# Patient Record
Sex: Female | Born: 1995 | Marital: Single | Smoking: Never smoker
Health system: Southern US, Community
[De-identification: ages and names within clinical notes are randomized; demographics above are authoritative.]

## PROBLEM LIST (undated history)

## (undated) DIAGNOSIS — O039 Complete or unspecified spontaneous abortion without complication: Secondary | ICD-10-CM

## (undated) DIAGNOSIS — Z789 Other specified health status: Secondary | ICD-10-CM

## (undated) HISTORY — PX: OTHER SURGICAL HISTORY: SHX169

## (undated) HISTORY — DX: Complete or unspecified spontaneous abortion without complication: O03.9

## (undated) HISTORY — DX: Other specified health status: Z78.9

---

## 2019-10-01 ENCOUNTER — Other Ambulatory Visit: Payer: Self-pay | Admitting: *Deleted

## 2019-10-01 DIAGNOSIS — Z20822 Contact with and (suspected) exposure to covid-19: Secondary | ICD-10-CM

## 2019-10-02 LAB — NOVEL CORONAVIRUS, NAA: SARS-CoV-2, NAA: NOT DETECTED

## 2020-08-24 ENCOUNTER — Ambulatory Visit: Payer: 59 | Admitting: Adult Health

## 2020-08-24 ENCOUNTER — Encounter: Payer: Self-pay | Admitting: Adult Health

## 2020-08-24 ENCOUNTER — Other Ambulatory Visit: Payer: Self-pay

## 2020-08-24 ENCOUNTER — Other Ambulatory Visit (HOSPITAL_COMMUNITY)
Admission: RE | Admit: 2020-08-24 | Discharge: 2020-08-24 | Disposition: A | Discharge status: Home / Self Care | Payer: 59 | Source: Ambulatory Visit | Attending: Adult Health | Admitting: Adult Health

## 2020-08-24 VITALS — BP 154/97 | HR 104 | Ht 65.0 in | Wt 207.0 lb

## 2020-08-24 DIAGNOSIS — Z3202 Encounter for pregnancy test, result negative: Secondary | ICD-10-CM

## 2020-08-24 DIAGNOSIS — Z113 Encounter for screening for infections with a predominantly sexual mode of transmission: Secondary | ICD-10-CM | POA: Diagnosis not present

## 2020-08-24 DIAGNOSIS — R030 Elevated blood-pressure reading, without diagnosis of hypertension: Secondary | ICD-10-CM | POA: Diagnosis not present

## 2020-08-24 DIAGNOSIS — N926 Irregular menstruation, unspecified: Secondary | ICD-10-CM | POA: Diagnosis not present

## 2020-08-24 LAB — POCT URINE PREGNANCY: Preg Test, Ur: NEGATIVE

## 2020-08-24 NOTE — Progress Notes (Signed)
  Subjective:     Patient ID: Erica Williams, female   DOB: December 02, 1996, 24 y.o.   MRN: [id number]  HPI Erica Williams is a 24 year old black female,single, G1P0010, in for UPT,has missed a period, just spotted in August and said took Plan B in July.  She is not sure if wants to be pregnant or not, has OCs at home just has not taken them or used a condom. No PCP.   Review of Systems +missed period Reviewed past medical,surgical, social and family history. Reviewed medications and allergies.     Objective:   Physical Exam BP (!) 154/97 (BP Location: Right Arm, Patient Position: Sitting, Cuff Size: Normal)   Pulse (!) 104   Ht 5\' 5"  (1.651 m)   Wt 207 lb (93.9 kg)   LMP 07/25/2020 (Exact Date) Comment: spotting for day and half  BMI 34.45 kg/m UPT is negative. Skin warm and dry.Pelvic: external genitalia is normal in appearance no lesions, vagina: scant white discharge with odor,urethra has no lesions or masses noted, cervix:smooth, uterus: normal size, shape and contour, non tender, no masses felt, adnexa: no masses or tenderness noted. Bladder is non tender and no masses felt. CV swab obtained.  Upstream - 08/24/20 1428      Pregnancy Intention Screening   Does the patient want to become pregnant in the next year? Unsure    Does the patient's partner want to become pregnant in the next year? Unsure    Would the patient like to discuss contraceptive options today? No      Contraception Wrap Up   Current Method No Method - Other Reason    End Method No Method - Other Reason    Contraception Counseling Provided No         Examination chaperoned by 08/26/20 LPN.    Assessment:     1. Urine pregnancy test negative  2. Missed period Call if no period in 2 weeks   3. Screening examination for STD (sexually transmitted disease) CV swab sent Check HIV,RPR, and hepatitis C antibody   4. Elevated BP without diagnosis of hypertension Will recheck in 4 weeks Review DASH diet      Plan:     Will talk when results back  Return in 4 weeks for pap and physical

## 2020-08-24 NOTE — Patient Instructions (Signed)
DASH Eating Plan DASH stands for "Dietary Approaches to Stop Hypertension." The DASH eating plan is a healthy eating plan that has been shown to reduce high blood pressure (hypertension). It may also reduce your risk for type 2 diabetes, heart disease, and stroke. The DASH eating plan may also help with weight loss. What are tips for following this plan?  General guidelines  Avoid eating more than 2,300 mg (milligrams) of salt (sodium) a day. If you have hypertension, you may need to reduce your sodium intake to 1,500 mg a day.  Limit alcohol intake to no more than 1 drink a day for nonpregnant women and 2 drinks a day for men. One drink equals 12 oz of beer, 5 oz of wine, or 1 oz of hard liquor.  Work with your health care provider to maintain a healthy body weight or to lose weight. Ask what an ideal weight is for you.  Get at least 30 minutes of exercise that causes your heart to beat faster (aerobic exercise) most days of the week. Activities may include walking, swimming, or biking.  Work with your health care provider or diet and nutrition specialist (dietitian) to adjust your eating plan to your individual calorie needs. Reading food labels   Check food labels for the amount of sodium per serving. Choose foods with less than 5 percent of the Daily Value of sodium. Generally, foods with less than 300 mg of sodium per serving fit into this eating plan.  To find whole grains, look for the word "whole" as the first word in the ingredient list. Shopping  Buy products labeled as "low-sodium" or "no salt added."  Buy fresh foods. Avoid canned foods and premade or frozen meals. Cooking  Avoid adding salt when cooking. Use salt-free seasonings or herbs instead of table salt or sea salt. Check with your health care provider or pharmacist before using salt substitutes.  Do not fry foods. Cook foods using healthy methods such as baking, boiling, grilling, and broiling instead.  Cook with  heart-healthy oils, such as olive, canola, soybean, or sunflower oil. Meal planning  Eat a balanced diet that includes: ? 5 or more servings of fruits and vegetables each day. At each meal, try to fill half of your plate with fruits and vegetables. ? Up to 6-8 servings of whole grains each day. ? Less than 6 oz of lean meat, poultry, or fish each day. A 3-oz serving of meat is about the same size as a deck of cards. One egg equals 1 oz. ? 2 servings of low-fat dairy each day. ? A serving of nuts, seeds, or beans 5 times each week. ? Heart-healthy fats. Healthy fats called Omega-3 fatty acids are found in foods such as flaxseeds and coldwater fish, like sardines, salmon, and mackerel.  Limit how much you eat of the following: ? Canned or prepackaged foods. ? Food that is high in trans fat, such as fried foods. ? Food that is high in saturated fat, such as fatty meat. ? Sweets, desserts, sugary drinks, and other foods with added sugar. ? Full-fat dairy products.  Do not salt foods before eating.  Try to eat at least 2 vegetarian meals each week.  Eat more home-cooked food and less restaurant, buffet, and fast food.  When eating at a restaurant, ask that your food be prepared with less salt or no salt, if possible. What foods are recommended? The items listed may not be a complete list. Talk with your dietitian about   what dietary choices are best for you. Grains Whole-grain or whole-wheat bread. Whole-grain or whole-wheat pasta. Brown rice. Oatmeal. Quinoa. Bulgur. Whole-grain and low-sodium cereals. Pita bread. Low-fat, low-sodium crackers. Whole-wheat flour tortillas. Vegetables Fresh or frozen vegetables (raw, steamed, roasted, or grilled). Low-sodium or reduced-sodium tomato and vegetable juice. Low-sodium or reduced-sodium tomato sauce and tomato paste. Low-sodium or reduced-sodium canned vegetables. Fruits All fresh, dried, or frozen fruit. Canned fruit in natural juice (without  added sugar). Meat and other protein foods Skinless chicken or turkey. Ground chicken or turkey. Pork with fat trimmed off. Fish and seafood. Egg whites. Dried beans, peas, or lentils. Unsalted nuts, nut butters, and seeds. Unsalted canned beans. Lean cuts of beef with fat trimmed off. Low-sodium, lean deli meat. Dairy Low-fat (1%) or fat-free (skim) milk. Fat-free, low-fat, or reduced-fat cheeses. Nonfat, low-sodium ricotta or cottage cheese. Low-fat or nonfat yogurt. Low-fat, low-sodium cheese. Fats and oils Soft margarine without trans fats. Vegetable oil. Low-fat, reduced-fat, or light mayonnaise and salad dressings (reduced-sodium). Canola, safflower, olive, soybean, and sunflower oils. Avocado. Seasoning and other foods Herbs. Spices. Seasoning mixes without salt. Unsalted popcorn and pretzels. Fat-free sweets. What foods are not recommended? The items listed may not be a complete list. Talk with your dietitian about what dietary choices are best for you. Grains Baked goods made with fat, such as croissants, muffins, or some breads. Dry pasta or rice meal packs. Vegetables Creamed or fried vegetables. Vegetables in a cheese sauce. Regular canned vegetables (not low-sodium or reduced-sodium). Regular canned tomato sauce and paste (not low-sodium or reduced-sodium). Regular tomato and vegetable juice (not low-sodium or reduced-sodium). Pickles. Olives. Fruits Canned fruit in a light or heavy syrup. Fried fruit. Fruit in cream or butter sauce. Meat and other protein foods Fatty cuts of meat. Ribs. Fried meat. Bacon. Sausage. Bologna and other processed lunch meats. Salami. Fatback. Hotdogs. Bratwurst. Salted nuts and seeds. Canned beans with added salt. Canned or smoked fish. Whole eggs or egg yolks. Chicken or turkey with skin. Dairy Whole or 2% milk, cream, and half-and-half. Whole or full-fat cream cheese. Whole-fat or sweetened yogurt. Full-fat cheese. Nondairy creamers. Whipped toppings.  Processed cheese and cheese spreads. Fats and oils Butter. Stick margarine. Lard. Shortening. Ghee. Bacon fat. Tropical oils, such as coconut, palm kernel, or palm oil. Seasoning and other foods Salted popcorn and pretzels. Onion salt, garlic salt, seasoned salt, table salt, and sea salt. Worcestershire sauce. Tartar sauce. Barbecue sauce. Teriyaki sauce. Soy sauce, including reduced-sodium. Steak sauce. Canned and packaged gravies. Fish sauce. Oyster sauce. Cocktail sauce. Horseradish that you find on the shelf. Ketchup. Mustard. Meat flavorings and tenderizers. Bouillon cubes. Hot sauce and Tabasco sauce. Premade or packaged marinades. Premade or packaged taco seasonings. Relishes. Regular salad dressings. Where to find more information:  National Heart, Lung, and Blood Institute: www.nhlbi.nih.gov  American Heart Association: www.heart.org Summary  The DASH eating plan is a healthy eating plan that has been shown to reduce high blood pressure (hypertension). It may also reduce your risk for type 2 diabetes, heart disease, and stroke.  With the DASH eating plan, you should limit salt (sodium) intake to 2,300 mg a day. If you have hypertension, you may need to reduce your sodium intake to 1,500 mg a day.  When on the DASH eating plan, aim to eat more fresh fruits and vegetables, whole grains, lean proteins, low-fat dairy, and heart-healthy fats.  Work with your health care provider or diet and nutrition specialist (dietitian) to adjust your eating plan to your   individual calorie needs. This information is not intended to replace advice given to you by your health care provider. Make sure you discuss any questions you have with your health care provider. Document Revised: 11/10/2017 Document Reviewed: 11/21/2016 Elsevier Patient Education  2020 Elsevier Inc.  

## 2020-08-25 ENCOUNTER — Telehealth: Payer: Self-pay | Admitting: *Deleted

## 2020-08-25 LAB — HEPATITIS C ANTIBODY: Hep C Virus Ab: <0.1 s/co ratio (ref 0.0–0.9)

## 2020-08-25 LAB — RPR: RPR Ser Ql: NONREACTIVE

## 2020-08-25 LAB — HIV ANTIBODY (ROUTINE TESTING W REFLEX): HIV Screen 4th Generation wRfx: NONREACTIVE

## 2020-08-25 NOTE — Telephone Encounter (Signed)
Telephoned patient at home number and advised patient of negative test results. Patient voiced understanding.

## 2020-08-26 LAB — CERVICOVAGINAL ANCILLARY ONLY
Bacterial Vaginitis (gardnerella): POSITIVE — AB
Candida Glabrata: NEGATIVE
Candida Vaginitis: POSITIVE — AB
Chlamydia: NEGATIVE
Comment: NEGATIVE
Comment: NEGATIVE
Comment: NEGATIVE
Comment: NEGATIVE
Comment: NEGATIVE
Comment: NORMAL
Neisseria Gonorrhea: NEGATIVE
Trichomonas: NEGATIVE

## 2020-08-28 ENCOUNTER — Telehealth: Payer: Self-pay | Admitting: Adult Health

## 2020-08-28 MED ORDER — METRONIDAZOLE 500 MG PO TABS: 500.0000 mg | ORAL_TABLET | Freq: Two times a day (BID) | ORAL | 0 refills | Status: DC

## 2020-08-28 MED ORDER — FLUCONAZOLE 150 MG PO TABS: 150.0000 mg | ORAL_TABLET | Freq: Every day | ORAL | 1 refills | Status: DC

## 2020-08-28 NOTE — Telephone Encounter (Signed)
Pt aware that +yeast and +BV on vaginal swab will rx diflucan and flagyl.other tests negative

## 2020-09-03 ENCOUNTER — Telehealth: Payer: Self-pay | Admitting: Adult Health

## 2020-09-03 NOTE — Telephone Encounter (Signed)
Patient called stating that she came into the office on 08/24/2020 for Bacterial Vaginosis, pt states that now she is having pain and burning when she urinated. She thinks she might have a UTI, we do not have any available appointment coming up. Spoke with the nurse and she stated that patient should go to Urgent care. I gave the patient the advised per nurse.

## 2020-09-05 ENCOUNTER — Emergency Department (HOSPITAL_COMMUNITY)
Admission: EM | Admit: 2020-09-05 | Discharge: 2020-09-05 | Disposition: A | Discharge status: Home / Self Care | Payer: 59 | Attending: Emergency Medicine | Admitting: Emergency Medicine

## 2020-09-05 ENCOUNTER — Emergency Department (HOSPITAL_COMMUNITY): Payer: 59

## 2020-09-05 ENCOUNTER — Encounter (HOSPITAL_COMMUNITY): Payer: Self-pay | Admitting: Emergency Medicine

## 2020-09-05 ENCOUNTER — Other Ambulatory Visit: Payer: Self-pay

## 2020-09-05 DIAGNOSIS — R1011 Right upper quadrant pain: Secondary | ICD-10-CM | POA: Diagnosis present

## 2020-09-05 DIAGNOSIS — N200 Calculus of kidney: Secondary | ICD-10-CM | POA: Diagnosis not present

## 2020-09-05 DIAGNOSIS — N23 Unspecified renal colic: Secondary | ICD-10-CM | POA: Diagnosis not present

## 2020-09-05 LAB — CBC
HCT: 40.2 % (ref 36.0–46.0)
Hemoglobin: 13.8 g/dL (ref 12.0–15.0)
MCH: 30.9 pg (ref 26.0–34.0)
MCHC: 34.3 g/dL (ref 30.0–36.0)
MCV: 90.1 fL (ref 80.0–100.0)
Platelets: 312 10*3/uL (ref 150–400)
RBC: 4.46 MIL/uL (ref 3.87–5.11)
RDW: 11.9 % (ref 11.5–15.5)
WBC: 13.9 10*3/uL — ABNORMAL HIGH (ref 4.0–10.5)
nRBC: 0 % (ref 0.0–0.2)

## 2020-09-05 LAB — URINALYSIS, ROUTINE W REFLEX MICROSCOPIC
Bacteria, UA: NONE SEEN
Bilirubin Urine: NEGATIVE
Glucose, UA: NEGATIVE mg/dL
Ketones, ur: 80 mg/dL — AB
Leukocytes,Ua: NEGATIVE
Nitrite: NEGATIVE
Protein, ur: NEGATIVE mg/dL
Specific Gravity, Urine: 1.019 (ref 1.005–1.030)
pH: 6 (ref 5.0–8.0)

## 2020-09-05 LAB — COMPREHENSIVE METABOLIC PANEL
ALT: 22 U/L (ref 0–44)
AST: 24 U/L (ref 15–41)
Albumin: 4.3 g/dL (ref 3.5–5.0)
Alkaline Phosphatase: 43 U/L (ref 38–126)
Anion gap: 10 (ref 5–15)
BUN: 15 mg/dL (ref 6–20)
CO2: 25 mmol/L (ref 22–32)
Calcium: 9.2 mg/dL (ref 8.9–10.3)
Chloride: 99 mmol/L (ref 98–111)
Creatinine, Ser: 1.11 mg/dL — ABNORMAL HIGH (ref 0.44–1.00)
GFR calc Af Amer: >60 mL/min (ref >60)
GFR calc non Af Amer: >60 mL/min (ref >60)
Glucose, Bld: 81 mg/dL (ref 70–99)
Potassium: 3.8 mmol/L (ref 3.5–5.1)
Sodium: 134 mmol/L — ABNORMAL LOW (ref 135–145)
Total Bilirubin: 0.8 mg/dL (ref 0.3–1.2)
Total Protein: 8.4 g/dL — ABNORMAL HIGH (ref 6.5–8.1)

## 2020-09-05 LAB — PREGNANCY, URINE: Preg Test, Ur: NEGATIVE

## 2020-09-05 LAB — LIPASE, BLOOD: Lipase: 19 U/L (ref 11–51)

## 2020-09-05 MED ORDER — FENTANYL CITRATE (PF) 100 MCG/2ML IJ SOLN
50.0000 ug | Freq: Once | INTRAMUSCULAR | Status: AC
  Administered 2020-09-05: 50 ug via INTRAVENOUS
  Filled 2020-09-05: qty 2

## 2020-09-05 MED ORDER — ONDANSETRON HCL 4 MG PO TABS: 4.0000 mg | ORAL_TABLET | Freq: Three times a day (TID) | ORAL | 0 refills | Status: DC | PRN

## 2020-09-05 MED ORDER — OXYCODONE-ACETAMINOPHEN 5-325 MG PO TABS: 1.0000 | ORAL_TABLET | ORAL | 0 refills | Status: DC | PRN

## 2020-09-05 MED ORDER — ONDANSETRON HCL 4 MG/2ML IJ SOLN
4.0000 mg | Freq: Once | INTRAMUSCULAR | Status: AC
  Administered 2020-09-05: 4 mg via INTRAVENOUS
  Filled 2020-09-05: qty 2

## 2020-09-05 NOTE — ED Triage Notes (Signed)
Pt presents today right side abd pain x3 days. Pt taking tylenol. Went to urgent care but they weren't about to do any imaging.

## 2020-09-05 NOTE — ED Provider Notes (Signed)
[facility] EMERGENCY DEPARTMENT Provider Note   CSN: [id number] Arrival date & time: 09/05/20  1417     History Chief Complaint  Patient presents with  . Flank Pain    Erica Williams is a 24 y.o. female who presents for evaluation of abdominal and flank pain. Patient states that she has had 3 days of severe pain in her right flank and right upper quadrant radiating down into the right leg. She states that sometimes it feels worse with movement but mostly she is having pain that is constant, waxing and waning, and does not seem to get any better with position, movement or Tylenol. Nothing seems to make it worsen. She denies urinary symptoms or vaginal symptoms. She never had anything like this before. She has no previous history of surgeries to her abdomen. She was seen at the urgent care told to have blood in her urine, might have a kidney stone she come to the emergency department for further evaluation.  HPI     History reviewed. No pertinent past medical history.  Patient Active Problem List   Diagnosis Date Noted  . Missed period 08/24/2020  . Urine pregnancy test negative 08/24/2020  . Screening examination for STD (sexually transmitted disease) 08/24/2020  . Elevated BP without diagnosis of hypertension 08/24/2020    Past Surgical History:  Procedure Laterality Date  . left knee surgery Left      OB History    Gravida  1   Para      Term      Preterm      AB  1   Living        SAB      TAB  1   Ectopic      Multiple      Live Births              Family History  Problem Relation Age of Onset  . Hypertension Father   . Hypertension Mother     Social History   Tobacco Use  . Smoking status: Never Smoker  . Smokeless tobacco: Never Used  Vaping Use  . Vaping Use: Every day  . Substances: Flavoring  Substance Use Topics  . Alcohol use: Not Currently  . Drug use: Never    Home Medications Prior to Admission medications   Medication  Sig Start Date End Date Taking? Authorizing Provider  fluconazole (DIFLUCAN) 150 MG tablet Take 1 tablet (150 mg total) by mouth daily. 08/28/20   Adline Potter, NP  metroNIDAZOLE (FLAGYL) 500 MG tablet Take 1 tablet (500 mg total) by mouth 2 (two) times daily. 08/28/20   Adline Potter, NP  ondansetron (ZOFRAN) 4 MG tablet Take 1 tablet (4 mg total) by mouth every 8 (eight) hours as needed for nausea or vomiting. 09/05/20   Arthor Captain, PA-C  oxyCODONE-acetaminophen (PERCOCET/ROXICET) 5-325 MG tablet Take 1 tablet by mouth every 4 (four) hours as needed for severe pain. 09/05/20   Arthor Captain, PA-C    Allergies    Patient has no known allergies.  Review of Systems   Review of Systems Ten systems reviewed and are negative for acute change, except as noted in the HPI.  Physical Exam Updated Vital Signs BP 124/72 (BP Location: Right Arm)   Pulse 77   Temp 97.8 F (36.6 C) (Oral)   Resp 16   Ht 5\' 5"  (1.651 m)   Wt 93.9 kg   LMP 08/26/2020   SpO2 100%   BMI  34.45 kg/m   Physical Exam Vitals and nursing note reviewed.  Constitutional:      General: She is not in acute distress.    Appearance: She is well-developed. She is not diaphoretic.  HENT:     Head: Normocephalic and atraumatic.  Eyes:     General: No scleral icterus.    Conjunctiva/sclera: Conjunctivae normal.  Cardiovascular:     Rate and Rhythm: Normal rate and regular rhythm.     Heart sounds: Normal heart sounds. No murmur heard.  No friction rub. No gallop.   Pulmonary:     Effort: Pulmonary effort is normal. No respiratory distress.     Breath sounds: Normal breath sounds.  Abdominal:     General: Bowel sounds are normal. There is no distension.     Palpations: Abdomen is soft. There is no mass.     Tenderness: There is no abdominal tenderness. There is right CVA tenderness. There is no guarding.  Musculoskeletal:     Cervical back: Normal range of motion.  Skin:    General: Skin is warm and  dry.  Neurological:     Mental Status: She is alert and oriented to person, place, and time.  Psychiatric:        Behavior: Behavior normal.     ED Results / Procedures / Treatments   Labs (all labs ordered are listed, but only abnormal results are displayed) Labs Reviewed  URINALYSIS, ROUTINE W REFLEX MICROSCOPIC - Abnormal; Notable for the following components:      Result Value   Hgb urine dipstick MODERATE (*)    Ketones, ur 80 (*)    All other components within normal limits  CBC - Abnormal; Notable for the following components:   WBC 13.9 (*)    All other components within normal limits  COMPREHENSIVE METABOLIC PANEL - Abnormal; Notable for the following components:   Sodium 134 (*)    Creatinine, Ser 1.11 (*)    Total Protein 8.4 (*)    All other components within normal limits  PREGNANCY, URINE  LIPASE, BLOOD  I-STAT BETA HCG BLOOD, ED ([facility], [facility], [facility] ONLY)    EKG None  Radiology CT Renal Stone Study  Result Date: 09/05/2020 CLINICAL DATA:  Right-sided flank pain. EXAM: CT ABDOMEN AND PELVIS WITHOUT CONTRAST TECHNIQUE: Multidetector CT imaging of the abdomen and pelvis was performed following the standard protocol without IV contrast. COMPARISON:  None. FINDINGS: Lower chest: The lung bases are clear. The heart size is normal. Hepatobiliary: The liver is normal. Normal gallbladder.There is no biliary ductal dilation. Pancreas: Normal contours without ductal dilatation. No peripancreatic fluid collection. Spleen: Unremarkable. Adrenals/Urinary Tract: --Adrenal glands: Unremarkable. --Right kidney/ureter: There is mild right-sided hydroureteronephrosis secondary to an obstructing 1 mm stone in the distal right ureter nearly at the right UVJ (axial series 2, image 78). --Left kidney/ureter: No hydronephrosis or radiopaque kidney stones. --Urinary bladder: Unremarkable. Stomach/Bowel: --Stomach/Duodenum: No hiatal hernia or other gastric abnormality. Normal duodenal course and  caliber. --Small bowel: Unremarkable. --Colon: Unremarkable. --Appendix: Normal. Vascular/Lymphatic: Normal course and caliber of the major abdominal vessels. --No retroperitoneal lymphadenopathy. --No mesenteric lymphadenopathy. --No pelvic or inguinal lymphadenopathy. Reproductive: Unremarkable Other: No ascites or free air. The abdominal wall is normal. Musculoskeletal. No acute displaced fractures. IMPRESSION: Mild right-sided hydroureteronephrosis secondary to an obstructing 1 mm stone in the distal right ureter nearly at the right UVJ. Electronically Signed   By: Katherine Mantle M.D.   On: 09/05/2020 18:37    Procedures Procedures (including critical care  time)  Medications Ordered in ED Medications  fentaNYL (SUBLIMAZE) injection 50 mcg (50 mcg Intravenous Given 09/05/20 1856)  ondansetron (ZOFRAN) injection 4 mg (4 mg Intravenous Given 09/05/20 1848)    ED Course  I have reviewed the triage vital signs and the nursing notes.  Pertinent labs & imaging results that were available during my care of the patient were reviewed by me and considered in my medical decision making (see chart for details).    MDM Rules/Calculators/A&P                          Given the large differential diagnosis for Yuka Lallier, the decision making in this case is of high complexity. I have reviewed the patient's labs which show  CBC 13.9, CMP with slightly elevated creatinine at 1.11, normal BUN, pregnancy test negative, urine shows no evidence of infection, lipase within normal limits I personally reviewed the patient's images which show Mild right-sided hydroureteronephrosis secondary to 1 mm obstructing right-sided UVJ stone. After evaluating all of the data points in this case, the presentation of Jaymee Tilson is uncomplicated ureteral colic secondary to ureterolithiasis  The presentation NOT consistent with an infected stone, nephric abscess, sepsis, or renal failure.  Similarly, this  presentation is NOT consistent with AAA; Mesenteric Ischemia; Bowel Perforation; Bowel Obstruction; Sigmoid Volvulus; Diverticulitis; Appendicitis; Peritonitis; Cholecystitis, ascending cholangitis or other gallbladder disease; perforated ulcer; significant GI bleeding, splenic rupture/infarction; Hepatic abscess; or other surgical/acute abdomen.  Similarly, this presentation is NOT consistent with ACS or Myocardial Ischemia; Pulmonary Embolism; fistula; incarcerated hernia; Pancreatitis, Aortic Dissection; Diabetic Ketoacidosis; Ischemic colitis; Psoas or other abscess; Methanol poisoning; Heavy metal toxicity; or porphyria.  Similarly, this case is NOT consistent with Fitz-Hugh-Curtis Syndrome, Ectopic Pregnancy, Placental Abruption, PID, Tubo-ovarian abscess, Ovarian Torsion, or STI.  Similarly, this presentation is NOT consistent with acute coronary syndrome, pulmonary embolism, dissection, borhaave's, arrythmia, pneumothorax, cardiac tamponade, or other emergent cardiopulmonary condition.  Similarly, this presentation is NOT consistent with pyelonephritis, urinary infection, pneumonia, or other focal bacterial infection.  Strict return and follow-up precautions have been given by me personally or by detailed written instruction verbalized by nursing staff using the teach back method. to the patient/family/caregiver(s).  Data Reviewed/Counseling: I have reviwed the patient's vital signs, nursing notes, and other relevant tests/information. I had a detailed discussion regarding the historical points, exam findings, and any diagnostic results supporting the discharge diagnosis. I also discussed the need for outpatient follow-up and the need to return to the ED if symptoms worsen or if there are any questions or concerns that arise at home.  Final Clinical Impression(s) / ED Diagnoses Final diagnoses:  Renal colic  Kidney stone    Rx / DC Orders ED Discharge Orders         Ordered     oxyCODONE-acetaminophen (PERCOCET/ROXICET) 5-325 MG tablet  Every 4 hours PRN,   Status:  Discontinued        09/05/20 1940    ondansetron (ZOFRAN) 4 MG tablet  Every 8 hours PRN        09/05/20 1940    oxyCODONE-acetaminophen (PERCOCET/ROXICET) 5-325 MG tablet  Every 4 hours PRN        09/05/20 1952           Arthor Captain, PA-C 09/06/20 1048    Benjiman Core, MD 09/06/20 [phone]

## 2020-09-05 NOTE — Discharge Instructions (Addendum)
Return to the ED immediately if you develop fever, uncontrolled pain or vomiting, or other concerns.   Contact a health care provider if: You have a fever or chills. Your urine smells bad or looks cloudy. You have pain or burning when you pass urine. Get help right away if: Your flank pain or groin pain suddenly worsens. You become confused or disoriented or you lose consciousness. 

## 2020-09-07 MED FILL — Oxycodone w/ Acetaminophen Tab 5-325 MG: ORAL | Qty: 6 | Status: AC

## 2020-12-12 NOTE — L&D Delivery Note (Signed)
OB/GYN Faculty Practice Delivery Note  Erica Williams is a 25 y.o. G2P1011 s/p SVD at [redacted]w[redacted]d. She was admitted for PROM.   ROM: 49h 57m with clear fluid GBS Status: Positive Maximum Maternal Temperature: 99.3  Labor Progress: Patient presented after PROM, was induced with cytotec and foley balloon and ultimately with pitocin. She also had forebags that were artificially ruptured twice. She had a prolonged labor course and was on pitocin for ~30 hours and then progressed to complete.  Delivery Date/Time: 2150 on 08/31/2021 Delivery: Called to room and patient was complete and pushing. Head delivered LOA. No nuchal cord present. The head then restituted however there was a shoulder dystocia and was relieved after McRoberts, suprapubic pressure, sidelying release and ultimately the shoulder was delivered with delivery of the posterior arm . Infant with spontaneous cry, placed on mother's abdomen, dried and stimulated. Cord clamped x 2  cut by Dr. Ephriam Jenkins. Cord blood drawn. Placenta delivered spontaneously with gentle cord traction. Fundus was firm however patient had a boggy lower uterine segment Patient had gush of liquid after delivery of placenta and was given TXA and Methergine and Cytotec. We also did 3 manual sweeps and removed several golf ball size clots. Dr. Jolayne Panther was called to room in order to assess for JADA placement, and at that time her bleeding had stopped so no further uterine atony interventions were needed.  Labia, perineum, vagina, and cervix inspected and found to have a right periurethral laceration which was repaired with a two interrupted sutures with  3-0 vicryl on SH, and left labial which was repaired with a running stitch with 3-0 vicryl, and 1st degree perineal laceration which was repaired with a 3-0 vicryl on CTB with running stitches. All areas that were sutured were noted to have excellent hemostasis.  Placenta: intact, 3V cord, to pathology for marginal cord  insertion Complications: Hemorrhage EBL 1118 Lacerations: Right periurethral, left labial, and 1st degree EBL: 1118cc Analgesia: epidural  Infant: female  APGARs 44,9  3626g  [name], MD, MPH OB Fellow, [facility] for [facility], [facility] 08/31/2021, 12:31 AM

## 2021-02-01 ENCOUNTER — Other Ambulatory Visit: Payer: Self-pay

## 2021-02-01 ENCOUNTER — Other Ambulatory Visit (INDEPENDENT_AMBULATORY_CARE_PROVIDER_SITE_OTHER): Payer: BLUE CROSS/BLUE SHIELD

## 2021-02-01 VITALS — BP 141/89 | HR 97 | Ht 65.0 in | Wt 218.0 lb

## 2021-02-01 DIAGNOSIS — N926 Irregular menstruation, unspecified: Secondary | ICD-10-CM | POA: Diagnosis not present

## 2021-02-01 LAB — POCT URINE PREGNANCY: Preg Test, Ur: POSITIVE — AB

## 2021-02-01 MED ORDER — BONJESTA 20-20 MG PO TBCR: 1.0000 | EXTENDED_RELEASE_TABLET | Freq: Every day | ORAL | 8 refills | Status: DC

## 2021-02-01 NOTE — Addendum Note (Signed)
Addended by: Cheral Marker on: 02/01/2021 05:12 PM   Modules accepted: Orders

## 2021-02-01 NOTE — Progress Notes (Addendum)
   NURSE VISIT- PREGNANCY CONFIRMATION   SUBJECTIVE:  Erica Williams is a 25 y.o. G2P0010 female at [redacted]w[redacted]d by certain LMP of Patient's last menstrual period was 12/01/2020 (exact date). Here for pregnancy confirmation.  Home pregnancy test: positive x 2  She reports nausea.  She is not taking prenatal vitamins.    OBJECTIVE:  BP (!) 141/89 (BP Location: Left Arm, Patient Position: Sitting, Cuff Size: Normal)   Pulse 97   Ht 5\' 5"  (1.651 m)   Wt 218 lb (98.9 kg)   LMP 12/01/2020 (Exact Date)   BMI 36.28 kg/m   Appears well, in no apparent distress OB History  Gravida Para Term Preterm AB Living  2       1    SAB IAB Ectopic Multiple Live Births    1          # Outcome Date GA Lbr Len/2nd Weight Sex Delivery Anes PTL Lv  2 Current           1 IAB             Results for orders placed or performed in visit on 02/01/21 (from the past 24 hour(s))  POCT urine pregnancy   Collection Time: 02/01/21  4:24 PM  Result Value Ref Range   Preg Test, Ur Positive (A) Negative    ASSESSMENT: Positive pregnancy test, [redacted]w[redacted]d by LMP    PLAN: Schedule for dating ultrasound in 1 week Prenatal vitamins: plans to begin OTC ASAP   Nausea medicines: requested-note routed to [redacted]w[redacted]d, [name] to send prescription   OB packet given: Yes  Charleen Madera A Laylah Riga  02/01/2021 4:32 PM   Chart reviewed for nurse visit. Agree with plan of care. [facility] [place], Cheral Marker 02/01/2021 5:12 PM

## 2021-02-02 ENCOUNTER — Telehealth: Payer: Self-pay

## 2021-02-02 ENCOUNTER — Other Ambulatory Visit: Payer: 59

## 2021-02-02 NOTE — Telephone Encounter (Signed)
Called pt to inform her of prescription sent for nausea. Changed pt's address per pt request. Pt confirmed understanding.

## 2021-02-02 NOTE — Telephone Encounter (Signed)
-----   Message from Cheral Marker, [place] sent at 02/01/2021  5:12 PM EST ----- Let her know I rx'd [place] to [facility] in [place]. They will contact her and ship it to her house.

## 2021-02-16 ENCOUNTER — Other Ambulatory Visit: Payer: Self-pay | Admitting: Obstetrics & Gynecology

## 2021-02-16 DIAGNOSIS — O3680X0 Pregnancy with inconclusive fetal viability, not applicable or unspecified: Secondary | ICD-10-CM

## 2021-02-17 ENCOUNTER — Ambulatory Visit (INDEPENDENT_AMBULATORY_CARE_PROVIDER_SITE_OTHER): Payer: BLUE CROSS/BLUE SHIELD

## 2021-02-17 ENCOUNTER — Other Ambulatory Visit: Payer: Self-pay

## 2021-02-17 ENCOUNTER — Other Ambulatory Visit: Payer: BLUE CROSS/BLUE SHIELD

## 2021-02-17 DIAGNOSIS — O3680X0 Pregnancy with inconclusive fetal viability, not applicable or unspecified: Secondary | ICD-10-CM

## 2021-02-17 NOTE — Progress Notes (Signed)
[place] 11+1 wks,single IUP,CRL 48.76 mm,fhr 171 bpm,normal ovaries

## 2021-02-23 ENCOUNTER — Other Ambulatory Visit: Payer: Self-pay | Admitting: Obstetrics & Gynecology

## 2021-02-23 DIAGNOSIS — Z3682 Encounter for antenatal screening for nuchal translucency: Secondary | ICD-10-CM

## 2021-02-24 ENCOUNTER — Ambulatory Visit (INDEPENDENT_AMBULATORY_CARE_PROVIDER_SITE_OTHER): Payer: BLUE CROSS/BLUE SHIELD | Admitting: Women's Health

## 2021-02-24 ENCOUNTER — Encounter: Payer: Self-pay | Admitting: Women's Health

## 2021-02-24 ENCOUNTER — Other Ambulatory Visit: Payer: Self-pay

## 2021-02-24 ENCOUNTER — Other Ambulatory Visit (HOSPITAL_COMMUNITY)
Admission: RE | Admit: 2021-02-24 | Discharge: 2021-02-24 | Disposition: A | Discharge status: Home / Self Care | Payer: BLUE CROSS/BLUE SHIELD | Source: Ambulatory Visit | Attending: Obstetrics & Gynecology | Admitting: Obstetrics & Gynecology

## 2021-02-24 ENCOUNTER — Ambulatory Visit (INDEPENDENT_AMBULATORY_CARE_PROVIDER_SITE_OTHER): Payer: BLUE CROSS/BLUE SHIELD

## 2021-02-24 VITALS — BP 130/88 | HR 86 | Wt 217.6 lb

## 2021-02-24 DIAGNOSIS — Z1379 Encounter for other screening for genetic and chromosomal anomalies: Secondary | ICD-10-CM

## 2021-02-24 DIAGNOSIS — Z3A12 12 weeks gestation of pregnancy: Secondary | ICD-10-CM

## 2021-02-24 DIAGNOSIS — Z3490 Encounter for supervision of normal pregnancy, unspecified, unspecified trimester: Secondary | ICD-10-CM | POA: Insufficient documentation

## 2021-02-24 DIAGNOSIS — Z3481 Encounter for supervision of other normal pregnancy, first trimester: Secondary | ICD-10-CM

## 2021-02-24 DIAGNOSIS — Z3491 Encounter for supervision of normal pregnancy, unspecified, first trimester: Secondary | ICD-10-CM

## 2021-02-24 DIAGNOSIS — Z124 Encounter for screening for malignant neoplasm of cervix: Secondary | ICD-10-CM | POA: Diagnosis not present

## 2021-02-24 DIAGNOSIS — Z3682 Encounter for antenatal screening for nuchal translucency: Secondary | ICD-10-CM | POA: Diagnosis not present

## 2021-02-24 LAB — POCT URINALYSIS DIPSTICK OB
Blood, UA: NEGATIVE
Glucose, UA: NEGATIVE
Ketones, UA: NEGATIVE
Leukocytes, UA: NEGATIVE
Nitrite, UA: NEGATIVE
POC,PROTEIN,UA: NEGATIVE

## 2021-02-24 MED ORDER — ASPIRIN 81 MG PO TBEC: 162.0000 mg | DELAYED_RELEASE_TABLET | Freq: Every day | ORAL | 2 refills | Status: DC

## 2021-02-24 NOTE — Patient Instructions (Signed)
Erica Williams, I greatly value your feedback.  If you receive a survey following your visit with [place] today, we appreciate you taking the time to fill it out.  Thanks, [name], [name], [name]   [facility] ([address] [place], [place] [address]) Entrance C, located off of [address] Free 24/7 valet parking   Nausea & Vomiting  Have saltine crackers or pretzels by your bed and eat a few bites before you raise your head out of bed in the morning  Eat small frequent meals throughout the day instead of large meals  Drink plenty of fluids throughout the day to stay hydrated, just don't drink a lot of fluids with your meals.  This can make your stomach fill up faster making you feel sick  Do not brush your teeth right after you eat  Products with real ginger are good for nausea, like ginger ale and ginger hard candy Make sure it says made with real ginger!  Sucking on sour candy like lemon heads is also good for nausea  If your prenatal vitamins make you nauseated, take them at night so you will sleep through the nausea  Sea Bands  If you feel like you need medicine for the nausea & vomiting please let us know  If you are unable to keep any fluids or food down please let us know   Constipation  Drink plenty of fluid, preferably water, throughout the day  Eat foods high in fiber such as fruits, vegetables, and grains  Exercise, such as walking, is a good way to keep your bowels regular  Drink warm fluids, especially warm prune juice, or decaf coffee  Eat a 1/2 cup of real oatmeal (not instant), 1/2 cup applesauce, and 1/2-1 cup warm prune juice every day  If needed, you may take Colace (docusate sodium) stool softener once or twice a day to help keep the stool soft.   If you still are having problems with constipation, you may take Miralax once daily as needed to help keep your bowels regular.   Home Blood Pressure Monitoring for Patients    Your provider has recommended that you check your blood pressure (BP) at least once a week at home. If you do not have a blood pressure cuff at home, one will be provided for you. Contact your provider if you have not received your monitor within 1 week.   Helpful Tips for Accurate Home Blood Pressure Checks  . Don't smoke, exercise, or drink caffeine 30 minutes before checking your BP . Use the restroom before checking your BP (a full bladder can raise your pressure) . Relax in a comfortable upright chair . Feet on the ground . Left arm resting comfortably on a flat surface at the level of your heart . Legs uncrossed . Back supported . Sit quietly and don't talk . Place the cuff on your bare arm . Adjust snuggly, so that only two fingertips can fit between your skin and the top of the cuff . Check 2 readings separated by at least one minute . Keep a log of your BP readings . For a visual, please reference this diagram: http://ccnc.care/bpdiagram  Provider Name: [facility]     Phone: [phone]  Zone 1: ALL CLEAR  Continue to monitor your symptoms:  . BP reading is less than 140 (top number) or less than 90 (bottom number)  . No right upper stomach pain . No headaches or seeing  spots . No feeling nauseated or throwing up . No swelling in face and hands  Zone 2: CAUTION Call your doctor's office for any of the following:  . BP reading is greater than 140 (top number) or greater than 90 (bottom number)  . Stomach pain under your ribs in the middle or right side . Headaches or seeing spots . Feeling nauseated or throwing up . Swelling in face and hands  Zone 3: EMERGENCY  Seek immediate medical care if you have any of the following:  . BP reading is greater than160 (top number) or greater than 110 (bottom number) . Severe headaches not improving with Tylenol . Serious difficulty catching your breath . Any worsening symptoms from Zone 2    First Trimester of  Pregnancy The first trimester of pregnancy is from week 1 until the end of week 12 (months 1 through 3). A week after a sperm fertilizes an egg, the egg will implant on the wall of the uterus. This embryo will begin to develop into a baby. Genes from you and your partner are forming the baby. The female genes determine whether the baby is a boy or a girl. At 6-8 weeks, the eyes and face are formed, and the heartbeat can be seen on ultrasound. At the end of 12 weeks, all the baby's organs are formed.  Now that you are pregnant, you will want to do everything you can to have a healthy baby. Two of the most important things are to get good prenatal care and to follow your health care provider's instructions. Prenatal care is all the medical care you receive before the baby's birth. This care will help prevent, find, and treat any problems during the pregnancy and childbirth. BODY CHANGES Your body goes through many changes during pregnancy. The changes vary from woman to woman.   You may gain or lose a couple of pounds at first.  You may feel sick to your stomach (nauseous) and throw up (vomit). If the vomiting is uncontrollable, call your health care provider.  You may tire easily.  You may develop headaches that can be relieved by medicines approved by your health care provider.  You may urinate more often. Painful urination may mean you have a bladder infection.  You may develop heartburn as a result of your pregnancy.  You may develop constipation because certain hormones are causing the muscles that push waste through your intestines to slow down.  You may develop hemorrhoids or swollen, bulging veins (varicose veins).  Your breasts may begin to grow larger and become tender. Your nipples may stick out more, and the tissue that surrounds them (areola) may become darker.  Your gums may bleed and may be sensitive to brushing and flossing.  Dark spots or blotches (chloasma, mask of pregnancy)  may develop on your face. This will likely fade after the baby is born.  Your menstrual periods will stop.  You may have a loss of appetite.  You may develop cravings for certain kinds of food.  You may have changes in your emotions from day to day, such as being excited to be pregnant or being concerned that something may go wrong with the pregnancy and baby.  You may have more vivid and strange dreams.  You may have changes in your hair. These can include thickening of your hair, rapid growth, and changes in texture. Some women also have hair loss during or after pregnancy, or hair that feels dry or thin. Your hair  will most likely return to normal after your baby is born. WHAT TO EXPECT AT YOUR PRENATAL VISITS During a routine prenatal visit:  You will be weighed to make sure you and the baby are growing normally.  Your blood pressure will be taken.  Your abdomen will be measured to track your baby's growth.  The fetal heartbeat will be listened to starting around week 10 or 12 of your pregnancy.  Test results from any previous visits will be discussed. Your health care provider may ask you:  How you are feeling.  If you are feeling the baby move.  If you have had any abnormal symptoms, such as leaking fluid, bleeding, severe headaches, or abdominal cramping.  If you have any questions. Other tests that may be performed during your first trimester include:  Blood tests to find your blood type and to check for the presence of any previous infections. They will also be used to check for low iron levels (anemia) and Rh antibodies. Later in the pregnancy, blood tests for diabetes will be done along with other tests if problems develop.  Urine tests to check for infections, diabetes, or protein in the urine.  An ultrasound to confirm the proper growth and development of the baby.  An amniocentesis to check for possible genetic problems.  Fetal screens for spina bifida and  Down syndrome.  You may need other tests to make sure you and the baby are doing well. HOME CARE INSTRUCTIONS  Medicines  Follow your health care provider's instructions regarding medicine use. Specific medicines may be either safe or unsafe to take during pregnancy.  Take your prenatal vitamins as directed.  If you develop constipation, try taking a stool softener if your health care provider approves. Diet  Eat regular, well-balanced meals. Choose a variety of foods, such as meat or vegetable-based protein, fish, milk and low-fat dairy products, vegetables, fruits, and whole grain breads and cereals. Your health care provider will help you determine the amount of weight gain that is right for you.  Avoid raw meat and uncooked cheese. These carry germs that can cause birth defects in the baby.  Eating four or five small meals rather than three large meals a day may help relieve nausea and vomiting. If you start to feel nauseous, eating a few soda crackers can be helpful. Drinking liquids between meals instead of during meals also seems to help nausea and vomiting.  If you develop constipation, eat more high-fiber foods, such as fresh vegetables or fruit and whole grains. Drink enough fluids to keep your urine clear or pale yellow. Activity and Exercise  Exercise only as directed by your health care provider. Exercising will help you:  Control your weight.  Stay in shape.  Be prepared for labor and delivery.  Experiencing pain or cramping in the lower abdomen or low back is a good sign that you should stop exercising. Check with your health care provider before continuing normal exercises.  Try to avoid standing for long periods of time. Move your legs often if you must stand in one place for a long time.  Avoid heavy lifting.  Wear low-heeled shoes, and practice good posture.  You may continue to have sex unless your health care provider directs you otherwise. Relief of Pain  or Discomfort  Wear a good support bra for breast tenderness.    Take warm sitz baths to soothe any pain or discomfort caused by hemorrhoids. Use hemorrhoid cream if your health care provider approves.  Rest with your legs elevated if you have leg cramps or low back pain.  If you develop varicose veins in your legs, wear support hose. Elevate your feet for 15 minutes, 3-4 times a day. Limit salt in your diet. Prenatal Care  Schedule your prenatal visits by the twelfth week of pregnancy. They are usually scheduled monthly at first, then more often in the last 2 months before delivery.  Write down your questions. Take them to your prenatal visits.  Keep all your prenatal visits as directed by your health care provider. Safety  Wear your seat belt at all times when driving.  Make a list of emergency phone numbers, including numbers for family, friends, the hospital, and police and fire departments. General Tips  Ask your health care provider for a referral to a local prenatal education class. Begin classes no later than at the beginning of month 6 of your pregnancy.  Ask for help if you have counseling or nutritional needs during pregnancy. Your health care provider can offer advice or refer you to specialists for help with various needs.  Do not use hot tubs, steam rooms, or saunas.  Do not douche or use tampons or scented sanitary pads.  Do not cross your legs for long periods of time.  Avoid cat litter boxes and soil used by cats. These carry germs that can cause birth defects in the baby and possibly loss of the fetus by miscarriage or stillbirth.  Avoid all smoking, herbs, alcohol, and medicines not prescribed by your health care provider. Chemicals in these affect the formation and growth of the baby.  Schedule a dentist appointment. At home, brush your teeth with a soft toothbrush and be gentle when you floss. SEEK MEDICAL CARE IF:   You have dizziness.  You have mild  pelvic cramps, pelvic pressure, or nagging pain in the abdominal area.  You have persistent nausea, vomiting, or diarrhea.  You have a bad smelling vaginal discharge.  You have pain with urination.  You notice increased swelling in your face, hands, legs, or ankles. SEEK IMMEDIATE MEDICAL CARE IF:   You have a fever.  You are leaking fluid from your vagina.  You have spotting or bleeding from your vagina.  You have severe abdominal cramping or pain.  You have rapid weight gain or loss.  You vomit blood or material that looks like coffee grounds.  You are exposed to [place] measles and have never had them.  You are exposed to fifth disease or chickenpox.  You develop a severe headache.  You have shortness of breath.  You have any kind of trauma, such as from a fall or a car accident. Document Released: 11/22/2001 Document Revised: 04/14/2014 Document Reviewed: 10/08/2013 [facility] Patient Information 2015 [place], [place]. This information is not intended to replace advice given to you by your health care provider. Make sure you discuss any questions you have with your health care provider.

## 2021-02-24 NOTE — Progress Notes (Unsigned)
INITIAL OBSTETRICAL VISIT Patient name: Erica Williams MRN [id number]  Date of birth: August 23, 1996 Chief Complaint:   Initial Prenatal Visit  History of Present Illness:   Erica Williams is a 25 y.o. G5P0010 African American female at [redacted]w[redacted]d by LMP c/w u/s at 11 weeks with an Estimated Date of Delivery: 09/07/21 being seen today for her initial obstetrical visit.   Her obstetrical history is significant for EAB x 1.   Today she reports some nausea- declines meds.  Depression screen [facility] 2/9 02/24/2021  Decreased Interest 0  Down, Depressed, Hopeless 0  PHQ - 2 Score 0  Altered sleeping 0  Tired, decreased energy 0  Change in appetite 0  Feeling bad or failure about yourself  0  Trouble concentrating 0  Moving slowly or fidgety/restless 0  Suicidal thoughts 0  PHQ-9 Score 0    Patient's last menstrual period was 12/01/2020 (exact date). Last pap never. Results were: N/A Review of Systems:   Pertinent items are noted in HPI Denies cramping/contractions, leakage of fluid, vaginal bleeding, abnormal vaginal discharge w/ itching/odor/irritation, headaches, visual changes, shortness of breath, chest pain, abdominal pain, severe nausea/vomiting, or problems with urination or bowel movements unless otherwise stated above.  Pertinent History Reviewed:  Reviewed past medical,surgical, social, obstetrical and family history.  Reviewed problem list, medications and allergies. OB History  Gravida Para Term Preterm AB Living  2       1 0  SAB IAB Ectopic Multiple Live Births    1          # Outcome Date GA Lbr Len/2nd Weight Sex Delivery Anes PTL Lv  2 Current           1 IAB            Physical Assessment:   Vitals:   02/24/21 1418  BP: 130/88  Pulse: 86  Weight: 217 lb 9.6 oz (98.7 kg)  Body mass index is 36.21 kg/m.       Physical Examination:  General appearance - well appearing, and in no distress  Mental status - alert, oriented to person, place, and time  Psych:  She has a  normal mood and affect  Skin - warm and dry, normal color, no suspicious lesions noted  Chest - effort normal, all lung fields clear to auscultation bilaterally  Heart - normal rate and regular rhythm  Abdomen - soft, nontender  Extremities:  No swelling or varicosities noted  Pelvic - VULVA: normal appearing vulva with no masses, tenderness or lesions  VAGINA: normal appearing vagina with normal color and discharge, no lesions  CERVIX: normal appearing cervix without discharge or lesions, no CMT  Thin prep pap is done w/ reflex HR HPV cotesting  Chaperone: Faith Rogue    TODAY'S NT  [place] 12+1 wks,measurements c/w dates,posterior placenta,normal ovaries,fhr 152 bpm,NB present,unable to obtain NT because of fetal position,have pt come back today after appt.please-  Was still unable to get   Results for orders placed or performed in visit on 02/24/21 (from the past 24 hour(s))  POC Urinalysis Dipstick OB   Collection Time: 02/24/21  2:29 PM  Result Value Ref Range   Color, UA     Clarity, UA     Glucose, UA Negative Negative   Bilirubin, UA     Ketones, UA neg    Spec Grav, UA     Blood, UA neg    pH, UA     POC,PROTEIN,UA Negative Negative, Trace, Small (1+), Moderate (2+),  Large (3+), 4+   Urobilinogen, UA     Nitrite, UA neg    Leukocytes, UA Negative Negative   Appearance     Odor    HgB A1c   Collection Time: 02/24/21  3:06 PM  Result Value Ref Range   Hgb A1c MFr Bld 5.3 4.8 - 5.6 %   Est. average glucose Bld gHb Est-mCnc 105 mg/dL  CBC/D/Plt+RPR+Rh+ABO+Rub Ab...   Collection Time: 02/24/21  3:07 PM  Result Value Ref Range   Hepatitis B Surface Ag Negative Negative   HCV Ab <0.1 0.0 - 0.9 s/co ratio   RPR Ser Ql Non Reactive Non Reactive   Rubella Antibodies, IGG 8.82 Immune >0.99 index   ABO Grouping A    Rh Factor Positive    Antibody Screen Negative Negative   HIV Screen 4th Generation wRfx Non Reactive Non Reactive   WBC 11.6 (H) 3.4 - 10.8 x10E3/uL   RBC  3.93 3.77 - 5.28 x10E6/uL   Hemoglobin 12.3 11.1 - 15.9 g/dL   Hematocrit 16.1 09.6 - 46.6 %   MCV 90 79 - 97 fL   MCH 31.3 26.6 - 33.0 pg   MCHC 34.9 31.5 - 35.7 g/dL   RDW 04.5 40.9 - 81.1 %   Platelets 309 150 - 450 x10E3/uL   Neutrophils 72 Not Estab. %   Lymphs 20 Not Estab. %   Monocytes 8 Not Estab. %   Eos 0 Not Estab. %   Basos 0 Not Estab. %   Neutrophils Absolute 8.3 (H) 1.4 - 7.0 x10E3/uL   Lymphocytes Absolute 2.3 0.7 - 3.1 x10E3/uL   Monocytes Absolute 0.9 0.1 - 0.9 x10E3/uL   EOS (ABSOLUTE) 0.0 0.0 - 0.4 x10E3/uL   Basophils Absolute 0.0 0.0 - 0.2 x10E3/uL   Immature Granulocytes 0 Not Estab. %   Immature Grans (Abs) 0.0 0.0 - 0.1 x10E3/uL  Interpretation:   Collection Time: 02/24/21  3:07 PM  Result Value Ref Range   HCV Interp 1: Comment     Assessment & Plan:  1) Low-Risk Pregnancy G2P0010 at [redacted]w[redacted]d with an Estimated Date of Delivery: 09/07/21   2) Initial OB visit  Meds:  Meds ordered this encounter  Medications  . aspirin 81 MG EC tablet    Sig: Take 2 tablets (162 mg total) by mouth daily. Swallow whole.    Dispense:  180 tablet    Refill:  2    Order Specific Question:   Supervising Provider    Answer:   Duane Lope H [[id number]]    Initial labs obtained Continue prenatal vitamins Reviewed n/v relief measures and warning s/s to report Reviewed recommended weight gain based on pre-gravid BMI Encouraged well-balanced diet Genetic & carrier screening discussed: requests Panorama, AFP and Horizon 14 , was unable to obtain NT today Ultrasound discussed; fetal survey: requested [facility]NC completed> form faxed if has or is planning to apply for medicaid The nature of [facility] [employer] with multiple MDs and other Advanced Practice Providers was explained to patient; also emphasized that fellows, residents, and students are part of our team. Does home bp cuff. Check bp weekly, let us know if >140/90.   Indications for ASA therapy (per  uptodate) OR Two or more of the following: Nulliparity Yes Obesity (BMI>30 kg/m2) Yes   Indications for early A1C (per uptodate) BMI >=25 (>=23 in Asian women) AND one of the following High-risk race/ethnicity (eg, African American, Latino, Native American, Asian American, Pacific Islander) Yes  Follow-up: Return in about 3 weeks (around 03/17/2021) for LROB, [name], AFP, in person.   Orders Placed This Encounter  Procedures  . Urine Culture  . CBC/D/Plt+RPR+Rh+ABO+Rub Ab...  . Pain Management Screening Profile (10S)  . Genetic Screening  . HgB A1c  . Interpretation:  . POC Urinalysis Dipstick OB    Cheral Marker [name], [facility] 02/25/2021 8:47 AM

## 2021-02-24 NOTE — Progress Notes (Signed)
[place] 12+1 wks,measurements c/w dates,posterior placenta,normal ovaries,fhr 152 bpm,NB present,unable to obtain NT because of fetal position,have pt come back today after appt.please

## 2021-02-25 ENCOUNTER — Encounter: Payer: Self-pay | Admitting: Women's Health

## 2021-02-25 DIAGNOSIS — F1290 Cannabis use, unspecified, uncomplicated: Secondary | ICD-10-CM | POA: Insufficient documentation

## 2021-02-25 LAB — CBC/D/PLT+RPR+RH+ABO+RUB AB...
Antibody Screen: NEGATIVE
Basophils Absolute: 0 10*3/uL (ref 0.0–0.2)
Basos: 0 %
EOS (ABSOLUTE): 0 10*3/uL (ref 0.0–0.4)
Eos: 0 %
HCV Ab: <0.1 s/co ratio (ref 0.0–0.9)
HIV Screen 4th Generation wRfx: NONREACTIVE
Hematocrit: 35.2 % (ref 34.0–46.6)
Hemoglobin: 12.3 g/dL (ref 11.1–15.9)
Hepatitis B Surface Ag: NEGATIVE
Immature Grans (Abs): 0 10*3/uL (ref 0.0–0.1)
Immature Granulocytes: 0 %
Lymphocytes Absolute: 2.3 10*3/uL (ref 0.7–3.1)
Lymphs: 20 %
MCH: 31.3 pg (ref 26.6–33.0)
MCHC: 34.9 g/dL (ref 31.5–35.7)
MCV: 90 fL (ref 79–97)
Monocytes Absolute: 0.9 10*3/uL (ref 0.1–0.9)
Monocytes: 8 %
Neutrophils Absolute: 8.3 10*3/uL — ABNORMAL HIGH (ref 1.4–7.0)
Neutrophils: 72 %
Platelets: 309 10*3/uL (ref 150–450)
RBC: 3.93 x10E6/uL (ref 3.77–5.28)
RDW: 12.2 % (ref 11.7–15.4)
RPR Ser Ql: NONREACTIVE
Rh Factor: POSITIVE
Rubella Antibodies, IGG: 8.82 index (ref >0.99)
WBC: 11.6 10*3/uL — ABNORMAL HIGH (ref 3.4–10.8)

## 2021-02-25 LAB — PMP SCREEN PROFILE (10S), URINE
Amphetamine Scrn, Ur: NEGATIVE ng/mL
BARBITURATE SCREEN URINE: NEGATIVE ng/mL
BENZODIAZEPINE SCREEN, URINE: NEGATIVE ng/mL
CANNABINOIDS UR QL SCN: POSITIVE ng/mL — AB
Cocaine (Metab) Scrn, Ur: NEGATIVE ng/mL
Creatinine(Crt), U: 259.9 mg/dL (ref 20.0–300.0)
Methadone Screen, Urine: NEGATIVE ng/mL
OXYCODONE+OXYMORPHONE UR QL SCN: NEGATIVE ng/mL
Opiate Scrn, Ur: NEGATIVE ng/mL
Ph of Urine: 5.8 (ref 4.5–8.9)
Phencyclidine Qn, Ur: NEGATIVE ng/mL
Propoxyphene Scrn, Ur: NEGATIVE ng/mL

## 2021-02-25 LAB — HEMOGLOBIN A1C
Est. average glucose Bld gHb Est-mCnc: 105 mg/dL
Hgb A1c MFr Bld: 5.3 % (ref 4.8–5.6)

## 2021-02-25 LAB — HCV INTERPRETATION

## 2021-02-26 ENCOUNTER — Telehealth: Payer: Self-pay | Admitting: Women's Health

## 2021-02-26 ENCOUNTER — Encounter: Payer: Self-pay | Admitting: Women's Health

## 2021-02-26 DIAGNOSIS — R8271 Bacteriuria: Secondary | ICD-10-CM | POA: Insufficient documentation

## 2021-02-26 LAB — URINE CULTURE

## 2021-02-26 MED ORDER — AMOXICILLIN 500 MG PO CAPS: 500.0000 mg | ORAL_CAPSULE | Freq: Two times a day (BID) | ORAL | 0 refills | Status: DC

## 2021-02-26 NOTE — Addendum Note (Signed)
Addended by: Cheral Marker on: 02/26/2021 01:58 PM   Modules accepted: Orders

## 2021-02-26 NOTE — Telephone Encounter (Signed)
Attempted to call pt to notify of +urine cx and rx sent. Mailbox full. Will try again Monday. Cheral Marker, [name], [name] 02/26/2021 2:00 PM

## 2021-03-01 DIAGNOSIS — Z029 Encounter for administrative examinations, unspecified: Secondary | ICD-10-CM

## 2021-03-03 LAB — CYTOLOGY - PAP
Chlamydia: NEGATIVE
Comment: NEGATIVE
Comment: NEGATIVE
Comment: NEGATIVE
Comment: NORMAL
Diagnosis: UNDETERMINED — AB
HPV 16: NEGATIVE
HPV 18 / 45: NEGATIVE
High risk HPV: POSITIVE — AB
Neisseria Gonorrhea: NEGATIVE

## 2021-03-04 ENCOUNTER — Encounter: Payer: Self-pay | Admitting: *Deleted

## 2021-03-08 ENCOUNTER — Encounter: Payer: Self-pay | Admitting: Women's Health

## 2021-03-08 DIAGNOSIS — R87619 Unspecified abnormal cytological findings in specimens from cervix uteri: Secondary | ICD-10-CM | POA: Insufficient documentation

## 2021-03-17 ENCOUNTER — Encounter: Payer: BLUE CROSS/BLUE SHIELD | Admitting: Advanced Practice Midwife

## 2021-03-18 ENCOUNTER — Encounter: Payer: BLUE CROSS/BLUE SHIELD | Admitting: Obstetrics & Gynecology

## 2021-04-07 ENCOUNTER — Encounter: Payer: Self-pay | Admitting: Women's Health

## 2021-04-07 ENCOUNTER — Ambulatory Visit (INDEPENDENT_AMBULATORY_CARE_PROVIDER_SITE_OTHER): Payer: BLUE CROSS/BLUE SHIELD | Admitting: Women's Health

## 2021-04-07 ENCOUNTER — Other Ambulatory Visit: Payer: Self-pay

## 2021-04-07 ENCOUNTER — Telehealth: Payer: Self-pay | Admitting: Women's Health

## 2021-04-07 VITALS — BP 115/76 | HR 84 | Wt 222.5 lb

## 2021-04-07 DIAGNOSIS — Z87440 Personal history of urinary (tract) infections: Secondary | ICD-10-CM

## 2021-04-07 DIAGNOSIS — O09892 Supervision of other high risk pregnancies, second trimester: Secondary | ICD-10-CM

## 2021-04-07 DIAGNOSIS — Z363 Encounter for antenatal screening for malformations: Secondary | ICD-10-CM

## 2021-04-07 DIAGNOSIS — Z1379 Encounter for other screening for genetic and chromosomal anomalies: Secondary | ICD-10-CM

## 2021-04-07 DIAGNOSIS — Z3492 Encounter for supervision of normal pregnancy, unspecified, second trimester: Secondary | ICD-10-CM

## 2021-04-07 NOTE — Progress Notes (Signed)
   LOW-RISK PREGNANCY VISIT Patient name: Kyara Boxer MRN [id number]  Date of birth: [birth year] Chief Complaint:   Routine Prenatal Visit (Colpo today; AFP today)  History of Present Illness:   Surabhi Gadea is a 25 y.o. G2P0010 female at [redacted]w[redacted]d with an Estimated Date of Delivery: 09/07/21 being seen today for ongoing management of a low-risk pregnancy.  Depression screen [facility] 2/9 02/24/2021  Decreased Interest 0  Down, Depressed, Hopeless 0  PHQ - 2 Score 0  Altered sleeping 0  Tired, decreased energy 0  Change in appetite 0  Feeling bad or failure about yourself  0  Trouble concentrating 0  Moving slowly or fidgety/restless 0  Suicidal thoughts 0  PHQ-9 Score 0    Today she reports no complaints. Contractions: Not present. Vag. Bleeding: None.  Movement: Absent. denies leaking of fluid. Review of Systems:   Pertinent items are noted in HPI Denies abnormal vaginal discharge w/ itching/odor/irritation, headaches, visual changes, shortness of breath, chest pain, abdominal pain, severe nausea/vomiting, or problems with urination or bowel movements unless otherwise stated above. Pertinent History Reviewed:  Reviewed past medical,surgical, social, obstetrical and family history.  Reviewed problem list, medications and allergies. Physical Assessment:   Vitals:   04/07/21 0902  BP: 115/76  Pulse: 84  Weight: 222 lb 8 oz (100.9 kg)  Body mass index is 37.03 kg/m.        Physical Examination:   General appearance: Well appearing, and in no distress  Mental status: Alert, oriented to person, place, and time  Skin: Warm & dry  Cardiovascular: Normal heart rate noted  Respiratory: Normal respiratory effort, no distress  Abdomen: Soft, gravid, nontender  Pelvic: Cervical exam deferred         Extremities: Edema: None  Fetal Status: Fetal Heart Rate (bpm): 150   Movement: Absent    Chaperone: N/A   No results found for this or any previous visit (from the past 24 hour(s)).   Assessment & Plan:  1) Low-risk pregnancy G2P0010 at [redacted]w[redacted]d with an Estimated Date of Delivery: 09/07/21   2) Abnormal pap, scheduled for colpo today, but no MD to proctor me, so will reschedule for next visit  3) GBS+ urine recently> urine cx poc today   Meds: No orders of the defined types were placed in this encounter.  Labs/procedures today: AFP, cancelled last appt so did not get scheduled for anatomy u/s  Plan:  Continue routine obstetrical care  Next visit: prefers will be in person for u/s and colpo    Reviewed: Preterm labor symptoms and general obstetric precautions including but not limited to vaginal bleeding, contractions, leaking of fluid and fetal movement were reviewed in detail with the patient.  All questions were answered. Does have home bp cuff. Office bp cuff given: not applicable. Check bp weekly, let us know if consistently >140 and/or >90.  Follow-up: Return for 1st available, ZC:HYIFOYD (18wks now); then 4wks from now for [facility] w/ me for colpo (MD available).  No future appointments.  Orders Placed This Encounter  Procedures  . Urine Culture  . US OB Comp + 14 Wk  . AFP TETRA   Cheral Marker [name], [facility] 04/07/2021 9:44 AM

## 2021-04-07 NOTE — Telephone Encounter (Signed)
Patient called stating that she would like for the Provider to write her a notes regarding her restrictions. Patient states that she would like the note to go into her Mychart.

## 2021-04-07 NOTE — Patient Instructions (Signed)
Erica Williams, I greatly value your feedback.  If you receive a survey following your visit with [place] today, we appreciate you taking the time to fill it out.  Thanks, [name], [name], [name]  [facility] ([address] [place], [place] [address]) Entrance C, located off of [address] valet parking  Go to [email or web address] to register for FREE online childbirth classes  [place] Pediatricians/Family Doctors:  [facility] [phone]            [facility] [phone]                 [facility] [phone] (usually not accepting new patients unless you have family there already, you are always welcome to call and ask)       [facility] [phone]       [facility] Pediatricians/Family Doctors:   [facility]: [phone]  [facility]: [phone]  [facility]: [phone]  [facility] Doctors:   [facility]: [phone]   [facility]: [phone]  [facility] Doctors:  [facility]: [phone]    Home Blood Pressure Monitoring for Patients   Your provider has recommended that you check your blood pressure (BP) at least once a week at home. If you do not have a blood pressure cuff at home, one will be provided for you. Contact your provider if you have not received your monitor within 1 week.   Helpful Tips for Accurate Home Blood Pressure Checks  . Don't smoke, exercise, or drink caffeine 30 minutes before checking your BP . Use the restroom before checking your BP (a full bladder can raise your pressure) . Relax in a comfortable upright chair . Feet on the ground . Left arm resting comfortably on a flat surface at the level of your heart . Legs uncrossed . Back supported . Sit quietly and don't talk . Place the cuff on your bare arm . Adjust snuggly, so  that only two fingertips can fit between your skin and the top of the cuff . Check 2 readings separated by at least one minute . Keep a log of your BP readings . For a visual, please reference this diagram: http://ccnc.care/bpdiagram  Provider Name: [facility]     Phone: [phone]  Zone 1: ALL CLEAR  Continue to monitor your symptoms:  . BP reading is less than 140 (top number) or less than 90 (bottom number)  . No right upper stomach pain . No headaches or seeing spots . No feeling nauseated or throwing up . No swelling in face and hands  Zone 2: CAUTION Call your doctor's office for any of the following:  . BP reading is greater than 140 (top number) or greater than 90 (bottom number)  . Stomach pain under your ribs in the middle or right side . Headaches or seeing spots . Feeling nauseated or throwing up . Swelling in face and hands  Zone 3: EMERGENCY  Seek immediate medical care if you have any of the following:  . BP reading is greater than160 (top number) or greater than 110 (bottom number) . Severe headaches not improving with Tylenol . Serious difficulty catching your breath . Any worsening symptoms from Zone 2     Second Trimester of Pregnancy The second trimester is from week 14 through week 27 (months 4 through 6). The second trimester is often a time when you feel your best.  Your body has adjusted to being pregnant, and you begin to feel better physically. Usually, morning sickness has lessened or quit completely, you may have more energy, and you may have an increase in appetite. The second trimester is also a time when the fetus is growing rapidly. At the end of the sixth month, the fetus is about 9 inches long and weighs about 1 pounds. You will likely begin to feel the baby move (quickening) between 16 and 20 weeks of pregnancy. Body changes during your second trimester Your body continues to go through many changes during your second trimester. The  changes vary from woman to woman.  Your weight will continue to increase. You will notice your lower abdomen bulging out.  You may begin to get stretch marks on your hips, abdomen, and breasts.  You may develop headaches that can be relieved by medicines. The medicines should be approved by your health care provider.  You may urinate more often because the fetus is pressing on your bladder.  You may develop or continue to have heartburn as a result of your pregnancy.  You may develop constipation because certain hormones are causing the muscles that push waste through your intestines to slow down.  You may develop hemorrhoids or swollen, bulging veins (varicose veins).  You may have back pain. This is caused by: ? Weight gain. ? Pregnancy hormones that are relaxing the joints in your pelvis. ? A shift in weight and the muscles that support your balance.  Your breasts will continue to grow and they will continue to become tender.  Your gums may bleed and may be sensitive to brushing and flossing.  Dark spots or blotches (chloasma, mask of pregnancy) may develop on your face. This will likely fade after the baby is born.  A dark line from your belly button to the pubic area (linea nigra) may appear. This will likely fade after the baby is born.  You may have changes in your hair. These can include thickening of your hair, rapid growth, and changes in texture. Some women also have hair loss during or after pregnancy, or hair that feels dry or thin. Your hair will most likely return to normal after your baby is born.  What to expect at prenatal visits During a routine prenatal visit:  You will be weighed to make sure you and the fetus are growing normally.  Your blood pressure will be taken.  Your abdomen will be measured to track your baby's growth.  The fetal heartbeat will be listened to.  Any test results from the previous visit will be discussed.  Your health care  provider may ask you:  How you are feeling.  If you are feeling the baby move.  If you have had any abnormal symptoms, such as leaking fluid, bleeding, severe headaches, or abdominal cramping.  If you are using any tobacco products, including cigarettes, chewing tobacco, and electronic cigarettes.  If you have any questions.  Other tests that may be performed during your second trimester include:  Blood tests that check for: ? Low iron levels (anemia). ? High blood sugar that affects pregnant women (gestational diabetes) between 64 and 28 weeks. ? Rh antibodies. This is to check for a protein on red blood cells (Rh factor).  Urine tests to check for infections, diabetes, or protein in the urine.  An ultrasound to confirm the proper growth and development of the baby.  An amniocentesis to check for possible genetic problems.  Fetal screens  for spina bifida and Down syndrome.  HIV (human immunodeficiency virus) testing. Routine prenatal testing includes screening for HIV, unless you choose not to have this test.  Follow these instructions at home: Medicines  Follow your health care provider's instructions regarding medicine use. Specific medicines may be either safe or unsafe to take during pregnancy.  Take a prenatal vitamin that contains at least 600 micrograms (mcg) of folic acid.  If you develop constipation, try taking a stool softener if your health care provider approves. Eating and drinking  Eat a balanced diet that includes fresh fruits and vegetables, whole grains, good sources of protein such as meat, eggs, or tofu, and low-fat dairy. Your health care provider will help you determine the amount of weight gain that is right for you.  Avoid raw meat and uncooked cheese. These carry germs that can cause birth defects in the baby.  If you have low calcium intake from food, talk to your health care provider about whether you should take a daily calcium  supplement.  Limit foods that are high in fat and processed sugars, such as fried and sweet foods.  To prevent constipation: ? Drink enough fluid to keep your urine clear or pale yellow. ? Eat foods that are high in fiber, such as fresh fruits and vegetables, whole grains, and beans. Activity  Exercise only as directed by your health care provider. Most women can continue their usual exercise routine during pregnancy. Try to exercise for 30 minutes at least 5 days a week. Stop exercising if you experience uterine contractions.  Avoid heavy lifting, wear low heel shoes, and practice good posture.  A sexual relationship may be continued unless your health care provider directs you otherwise. Relieving pain and discomfort  Wear a good support bra to prevent discomfort from breast tenderness.  Take warm sitz baths to soothe any pain or discomfort caused by hemorrhoids. Use hemorrhoid cream if your health care provider approves.  Rest with your legs elevated if you have leg cramps or low back pain.  If you develop varicose veins, wear support hose. Elevate your feet for 15 minutes, 3-4 times a day. Limit salt in your diet. Prenatal Care  Write down your questions. Take them to your prenatal visits.  Keep all your prenatal visits as told by your health care provider. This is important. Safety  Wear your seat belt at all times when driving.  Make a list of emergency phone numbers, including numbers for family, friends, the hospital, and police and fire departments. General instructions  Ask your health care provider for a referral to a local prenatal education class. Begin classes no later than the beginning of month 6 of your pregnancy.  Ask for help if you have counseling or nutritional needs during pregnancy. Your health care provider can offer advice or refer you to specialists for help with various needs.  Do not use hot tubs, steam rooms, or saunas.  Do not douche or use  tampons or scented sanitary pads.  Do not cross your legs for long periods of time.  Avoid cat litter boxes and soil used by cats. These carry germs that can cause birth defects in the baby and possibly loss of the fetus by miscarriage or stillbirth.  Avoid all smoking, herbs, alcohol, and unprescribed drugs. Chemicals in these products can affect the formation and growth of the baby.  Do not use any products that contain nicotine or tobacco, such as cigarettes and e-cigarettes. If you need help quitting,  ask your health care provider.  Visit your dentist if you have not gone yet during your pregnancy. Use a soft toothbrush to brush your teeth and be gentle when you floss. Contact a health care provider if:  You have dizziness.  You have mild pelvic cramps, pelvic pressure, or nagging pain in the abdominal area.  You have persistent nausea, vomiting, or diarrhea.  You have a bad smelling vaginal discharge.  You have pain when you urinate. Get help right away if:  You have a fever.  You are leaking fluid from your vagina.  You have spotting or bleeding from your vagina.  You have severe abdominal cramping or pain.  You have rapid weight gain or weight loss.  You have shortness of breath with chest pain.  You notice sudden or extreme swelling of your face, hands, ankles, feet, or legs.  You have not felt your baby move in over an hour.  You have severe headaches that do not go away when you take medicine.  You have vision changes. Summary  The second trimester is from week 14 through week 27 (months 4 through 6). It is also a time when the fetus is growing rapidly.  Your body goes through many changes during pregnancy. The changes vary from woman to woman.  Avoid all smoking, herbs, alcohol, and unprescribed drugs. These chemicals affect the formation and growth your baby.  Do not use any tobacco products, such as cigarettes, chewing tobacco, and e-cigarettes. If you  need help quitting, ask your health care provider.  Contact your health care provider if you have any questions. Keep all prenatal visits as told by your health care provider. This is important. This information is not intended to replace advice given to you by your health care provider. Make sure you discuss any questions you have with your health care provider. Document Released: 11/22/2001 Document Revised: 05/05/2016 Document Reviewed: 01/29/2013 Elsevier Interactive Patient Education  2017 [employer].

## 2021-04-08 NOTE — Telephone Encounter (Signed)
Pt aware letter was sent to her MyChart stating no lifting, pulling or pushing greater than 25 lbs due to pregnancy. JSY

## 2021-04-09 LAB — AFP TETRA
DIA Mom Value: 1.14
DIA Value (EIA): 139.78 pg/mL
DSR (By Age)    1 IN: 954
DSR (Second Trimester) 1 IN: 10000
Gestational Age: 18.1 WEEKS
MSAFP Mom: 1.56
MSAFP: 56.6 ng/mL
MSHCG Mom: 0.56
MSHCG: 12861 m[IU]/mL
Maternal Age At EDD: 26.5 yr
Osb Risk: 2331
T18 (By Age): 1:3718 {titer}
Test Results:: NEGATIVE
Weight: 223 [lb_av]
uE3 Mom: 1.22
uE3 Value: 1.58 ng/mL

## 2021-04-09 LAB — URINE CULTURE

## 2021-04-29 ENCOUNTER — Other Ambulatory Visit: Payer: BLUE CROSS/BLUE SHIELD

## 2021-04-30 ENCOUNTER — Telehealth: Payer: Self-pay | Admitting: Women's Health

## 2021-04-30 NOTE — Telephone Encounter (Signed)
Patient wants to speak with a nurse about experiencing pain in her legs.

## 2021-04-30 NOTE — Telephone Encounter (Signed)
Pt is having pain in left leg. It's in upper thigh, worse when sitting down. Not warm to touch. Has a mosquito bite, so that's red but no other redness otherwise. 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 She is not working today, but still has discomfort. I spoke with Tish, RN. Pt was advised to watch it over the weekend. If pain gets worse, develops redness or warm to touch, go to hospital for evaluation. 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 I advised to use her own judgement as to how she is feeling. Pt voiced understanding. JSY

## 2021-05-03 ENCOUNTER — Other Ambulatory Visit: Payer: Self-pay

## 2021-05-03 ENCOUNTER — Ambulatory Visit (INDEPENDENT_AMBULATORY_CARE_PROVIDER_SITE_OTHER): Payer: BLUE CROSS/BLUE SHIELD | Admitting: Women's Health

## 2021-05-03 ENCOUNTER — Encounter: Payer: Self-pay | Admitting: Women's Health

## 2021-05-03 VITALS — BP 136/82 | HR 98 | Wt 228.0 lb

## 2021-05-03 DIAGNOSIS — Z3482 Encounter for supervision of other normal pregnancy, second trimester: Secondary | ICD-10-CM | POA: Diagnosis not present

## 2021-05-03 DIAGNOSIS — R87810 Cervical high risk human papillomavirus (HPV) DNA test positive: Secondary | ICD-10-CM

## 2021-05-03 DIAGNOSIS — R87610 Atypical squamous cells of undetermined significance on cytologic smear of cervix (ASC-US): Secondary | ICD-10-CM

## 2021-05-03 NOTE — Patient Instructions (Signed)
Erica Williams, I greatly value your feedback.  If you receive a survey following your visit with [place] today, we appreciate you taking the time to fill it out.  Thanks, [name], [name], [name]   You will have your sugar test next visit.  Please do not eat or drink anything after midnight the night before you come, not even water.  You will be here for at least two hours.  Please make an appointment online for the bloodwork at [email or web address] for 8:30am (or as close to this as possible). Make sure you select the [facility] service center. The day of the appointment, check in with our office first, then you will go to [facility] to start the sugar test.    [facility] [place], [place] [address]) Entrance C, located off of [address] valet parking  Go to [email or web address] to register for FREE online childbirth classes   Call the office [phone]) or go to [facility] if:  You begin to have strong, frequent contractions  Your water breaks.  Sometimes it is a big gush of fluid, sometimes it is just a trickle that keeps getting your panties wet or running down your legs  You have vaginal bleeding.  It is normal to have a small amount of spotting if your cervix was checked.   You don't feel your baby moving like normal.  If you don't, get you something to eat and drink and lay down and focus on feeling your baby move.   If your baby is still not moving like normal, you should call the office or go to [facility].  [place] Pediatricians/Family Doctors:  [facility] [phone]            [facility] [phone]                 [facility] [phone] (usually not accepting new patients unless you have family there already, you are always welcome to call and ask)       [facility] Department [phone]       [facility] Pediatricians/Family Doctors:   [facility]:  [phone]  [facility]: [phone]  [facility]: [phone]  [facility] Doctors:   [facility]: [phone]   [facility]: [phone]  [facility] Doctors:  [facility]: [phone]   Home Blood Pressure Monitoring for Patients   Your provider has recommended that you check your blood pressure (BP) at least once a week at home. If you do not have a blood pressure cuff at home, one will be provided for you. Contact your provider if you have not received your monitor within 1 week.   Helpful Tips for Accurate Home Blood Pressure Checks  . Don't smoke, exercise, or drink caffeine 30 minutes before checking your BP . Use the restroom before checking your BP (a full bladder can raise your pressure) . Relax in a comfortable upright chair . Feet on the ground . Left arm resting comfortably on a flat surface at the level of your heart . Legs uncrossed . Back supported . Sit quietly and don't talk . Place the cuff on your bare arm . Adjust snuggly, so that only two fingertips can fit between your skin and the top of the cuff . Check 2 readings separated by at least one minute . Keep a log of your BP readings . For a visual, please reference  this diagram: http://ccnc.care/bpdiagram  Provider Name: [facility]     Phone: [phone]  Zone 1: ALL CLEAR  Continue to monitor your symptoms:  . BP reading is less than 140 (top number) or less than 90 (bottom number)  . No right upper stomach pain . No headaches or seeing spots . No feeling nauseated or throwing up . No swelling in face and hands  Zone 2: CAUTION Call your doctor's office for any of the following:  . BP reading is greater than 140 (top number) or greater than 90 (bottom number)  . Stomach pain under your ribs in the middle or right side . Headaches or seeing spots . Feeling nauseated or throwing up . Swelling in  face and hands  Zone 3: EMERGENCY  Seek immediate medical care if you have any of the following:  . BP reading is greater than160 (top number) or greater than 110 (bottom number) . Severe headaches not improving with Tylenol . Serious difficulty catching your breath . Any worsening symptoms from Zone 2   Second Trimester of Pregnancy The second trimester is from week 13 through week 28, months 4 through 6. The second trimester is often a time when you feel your best. Your body has also adjusted to being pregnant, and you begin to feel better physically. Usually, morning sickness has lessened or quit completely, you may have more energy, and you may have an increase in appetite. The second trimester is also a time when the fetus is growing rapidly. At the end of the sixth month, the fetus is about 9 inches long and weighs about 1 pounds. You will likely begin to feel the baby move (quickening) between 18 and 20 weeks of the pregnancy. BODY CHANGES Your body goes through many changes during pregnancy. The changes vary from woman to woman.   Your weight will continue to increase. You will notice your lower abdomen bulging out.  You may begin to get stretch marks on your hips, abdomen, and breasts.  You may develop headaches that can be relieved by medicines approved by your health care provider.  You may urinate more often because the fetus is pressing on your bladder.  You may develop or continue to have heartburn as a result of your pregnancy.  You may develop constipation because certain hormones are causing the muscles that push waste through your intestines to slow down.  You may develop hemorrhoids or swollen, bulging veins (varicose veins).  You may have back pain because of the weight gain and pregnancy hormones relaxing your joints between the bones in your pelvis and as a result of a shift in weight and the muscles that support your balance.  Your breasts will continue to grow  and be tender.  Your gums may bleed and may be sensitive to brushing and flossing.  Dark spots or blotches (chloasma, mask of pregnancy) may develop on your face. This will likely fade after the baby is born.  A dark line from your belly button to the pubic area (linea nigra) may appear. This will likely fade after the baby is born.  You may have changes in your hair. These can include thickening of your hair, rapid growth, and changes in texture. Some women also have hair loss during or after pregnancy, or hair that feels dry or thin. Your hair will most likely return to normal after your baby is born. WHAT TO EXPECT AT YOUR PRENATAL VISITS During a routine prenatal visit:  You will  be weighed to make sure you and the fetus are growing normally.  Your blood pressure will be taken.  Your abdomen will be measured to track your baby's growth.  The fetal heartbeat will be listened to.  Any test results from the previous visit will be discussed. Your health care provider may ask you:  How you are feeling.  If you are feeling the baby move.  If you have had any abnormal symptoms, such as leaking fluid, bleeding, severe headaches, or abdominal cramping.  If you have any questions. Other tests that may be performed during your second trimester include:  Blood tests that check for:  Low iron levels (anemia).  Gestational diabetes (between 24 and 28 weeks).  Rh antibodies.  Urine tests to check for infections, diabetes, or protein in the urine.  An ultrasound to confirm the proper growth and development of the baby.  An amniocentesis to check for possible genetic problems.  Fetal screens for spina bifida and Down syndrome. HOME CARE INSTRUCTIONS   Avoid all smoking, herbs, alcohol, and unprescribed drugs. These chemicals affect the formation and growth of the baby.  Follow your health care provider's instructions regarding medicine use. There are medicines that are either  safe or unsafe to take during pregnancy.  Exercise only as directed by your health care provider. Experiencing uterine cramps is a good sign to stop exercising.  Continue to eat regular, healthy meals.  Wear a good support bra for breast tenderness.  Do not use hot tubs, steam rooms, or saunas.  Wear your seat belt at all times when driving.  Avoid raw meat, uncooked cheese, cat litter boxes, and soil used by cats. These carry germs that can cause birth defects in the baby.  Take your prenatal vitamins.  Try taking a stool softener (if your health care provider approves) if you develop constipation. Eat more high-fiber foods, such as fresh vegetables or fruit and whole grains. Drink plenty of fluids to keep your urine clear or pale yellow.  Take warm sitz baths to soothe any pain or discomfort caused by hemorrhoids. Use hemorrhoid cream if your health care provider approves.  If you develop varicose veins, wear support hose. Elevate your feet for 15 minutes, 3-4 times a day. Limit salt in your diet.  Avoid heavy lifting, wear low heel shoes, and practice good posture.  Rest with your legs elevated if you have leg cramps or low back pain.  Visit your dentist if you have not gone yet during your pregnancy. Use a soft toothbrush to brush your teeth and be gentle when you floss.  A sexual relationship may be continued unless your health care provider directs you otherwise.  Continue to go to all your prenatal visits as directed by your health care provider. SEEK MEDICAL CARE IF:   You have dizziness.  You have mild pelvic cramps, pelvic pressure, or nagging pain in the abdominal area.  You have persistent nausea, vomiting, or diarrhea.  You have a bad smelling vaginal discharge.  You have pain with urination. SEEK IMMEDIATE MEDICAL CARE IF:   You have a fever.  You are leaking fluid from your vagina.  You have spotting or bleeding from your vagina.  You have severe  abdominal cramping or pain.  You have rapid weight gain or loss.  You have shortness of breath with chest pain.  You notice sudden or extreme swelling of your face, hands, ankles, feet, or legs.  You have not felt your baby move in  over an hour.  You have severe headaches that do not go away with medicine.  You have vision changes. Document Released: 11/22/2001 Document Revised: 12/03/2013 Document Reviewed: 01/29/2013 [facility] Patient Information 2015 [place], [place]. This information is not intended to replace advice given to you by your health care provider. Make sure you discuss any questions you have with your health care provider.

## 2021-05-03 NOTE — Progress Notes (Signed)
LOW-RISK PREGNANCY VISIT Patient name: Erica Williams MRN [id number]  Date of birth: 05-May-1996 Chief Complaint:   Routine Prenatal Visit and Colposcopy  History of Present Illness:   Erica Williams is a 25 y.o. G2P0010 female at [redacted]w[redacted]d with an Estimated Date of Delivery: 09/07/21 being seen today for ongoing management of a low-risk pregnancy.  Depression screen [facility] 2/9 02/24/2021  Decreased Interest 0  Down, Depressed, Hopeless 0  PHQ - 2 Score 0  Altered sleeping 0  Tired, decreased energy 0  Change in appetite 0  Feeling bad or failure about yourself  0  Trouble concentrating 0  Moving slowly or fidgety/restless 0  Suicidal thoughts 0  PHQ-9 Score 0    Today she reports no complaints. Contractions: Not present. Vag. Bleeding: None.  Movement: Present. denies leaking of fluid. Review of Systems:   Pertinent items are noted in HPI Denies abnormal vaginal discharge w/ itching/odor/irritation, headaches, visual changes, shortness of breath, chest pain, abdominal pain, severe nausea/vomiting, or problems with urination or bowel movements unless otherwise stated above. Pertinent History Reviewed:  Reviewed past medical,surgical, social, obstetrical and family history.  Reviewed problem list, medications and allergies. Physical Assessment:   Vitals:   05/03/21 1611  BP: 136/82  Pulse: 98  Weight: 228 lb (103.4 kg)  Body mass index is 37.94 kg/m.        Physical Examination:   General appearance: Well appearing, and in no distress  Mental status: Alert, oriented to person, place, and time  Skin: Warm & dry  Cardiovascular: Normal heart rate noted  Respiratory: Normal respiratory effort, no distress  Abdomen: Soft, gravid, nontender  Pelvic: colpo- see note below         Extremities: Edema: None  Fetal Status: Fetal Heart Rate (bpm): 150 Fundal Height: 22 cm Movement: Present    Chaperone: Erica Williams   No results found for this or any previous visit (from the past  24 hour(s)).  Assessment & Plan:  1) Low-risk pregnancy G2P0010 at [redacted]w[redacted]d with an Estimated Date of Delivery: 09/07/21   2) Abnormal pap, colpo performed today- see note below   Meds: No orders of the defined types were placed in this encounter.  Labs/procedures today: colpo  Plan:  Continue routine obstetrical care  Next visit: prefers will be in person for pn2    Reviewed: Preterm labor symptoms and general obstetric precautions including but not limited to vaginal bleeding, contractions, leaking of fluid and fetal movement were reviewed in detail with the patient.  All questions were answered. Does have home bp cuff. Office bp cuff given: not applicable. Check bp weekly, let us know if consistently >140 and/or >90.  Follow-up: Return for as scheduled 5/25 for u/s; then 4wks for lrob w/ cnm in person w/ pn2.  Future Appointments  Date Time Provider Department Center  05/05/2021  2:30 PM [facility] [place] [facility] None  05/31/2021  8:50 AM [facility]  05/31/2021  9:50 AM Myna Hidalgo, DO [facility]    No orders of the defined types were placed in this encounter.  Cheral Marker [name], [facility] 05/03/2021 5:05 PM     COLPOSCOPY PROCEDURE NOTE Patient name: Erica Williams MRN [id number]  Date of birth: October 09, 1996 Subjective Findings:   Erica Williams is a 25 y.o. G2P0010 African American female at [redacted]w[redacted]d being seen today for a colposcopy. Indication: Abnormal pap on 02/24/21: ASCUS w/ HRHPV positive: other (not 16, 18/45)  Prior cytology:  Date Result Procedure  N/A               Patient's last menstrual period was 12/01/2020 (exact date). Contraception: none. Menopausal: no. Hysterectomy: no.   Smoker: no.  Immunocompromised: yes pregnant.  The risks and benefits were explained and informed consent was obtained, and written copy is in chart. Pertinent History Reviewed:   Reviewed past medical,surgical, social, obstetrical and family history.  Reviewed  problem list, medications and allergies. Objective Findings & Procedure:   Vitals:   05/03/21 1611  BP: 136/82  Pulse: 98  Weight: 228 lb (103.4 kg)  Body mass index is 37.94 kg/m.  Time out was performed.  Speculum placed in the vagina, cervix fully visualized. SCJ: fully visualized. Cervix swabbed x 3 with acetic acid.  Acetowhitening present: Yes Cervix: no mosaicism, no punctation, no abnormal vasculature and acetowhite lesion(s) noted at 12 o'clock. No biopsies taken. Vagina: vaginal colposcopy not performed Vulva: vulvar colposcopy not performed  Specimens: 0  Complications: none  Colposcopic Impression & Plan:   Colposcopy findings consistent w/ HPV effect Plan: Repeat colposcopy postpartum, 8-12wks  Return for as scheduled 5/25 for u/s; then 4wks for lrob w/ cnm in person w/ pn2.  Cheral Marker [name], [facility] 05/03/2021 5:05 PM

## 2021-05-05 ENCOUNTER — Other Ambulatory Visit: Payer: Self-pay

## 2021-05-05 ENCOUNTER — Ambulatory Visit (INDEPENDENT_AMBULATORY_CARE_PROVIDER_SITE_OTHER): Payer: BLUE CROSS/BLUE SHIELD

## 2021-05-05 DIAGNOSIS — Z3492 Encounter for supervision of normal pregnancy, unspecified, second trimester: Secondary | ICD-10-CM

## 2021-05-05 DIAGNOSIS — Z3A22 22 weeks gestation of pregnancy: Secondary | ICD-10-CM | POA: Diagnosis not present

## 2021-05-05 DIAGNOSIS — Z363 Encounter for antenatal screening for malformations: Secondary | ICD-10-CM | POA: Diagnosis not present

## 2021-05-05 NOTE — Progress Notes (Signed)
[place] 22+1 wks,cephalic,fhr 159 bpm,posterior placenta gr 0,normal ovaries,svp of fluid 4.1 cm,cx 3 cm,EFW 531 g 73%,anatomy complete,no obvious abnormalities

## 2021-05-28 ENCOUNTER — Telehealth: Payer: Self-pay

## 2021-05-28 NOTE — Telephone Encounter (Signed)
Pt called with c/o pain in lower abdomen, more at right groin area. Pt was instructed that it was probably round ligament pain. Instructed pt to splint her belly when moving or getting up and down, use a pillow when lying down to support her belly, or use a pregnancy belly band, and use a heating pad and Tylenol for pain. 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 She was instructed not to lift anything over 25 lbs and as long as she was doing the same type of job, she should be able to continue normally. Pt confirmed understanding and will be seen by Dr. Charlotta Newton on Monday 6/20.

## 2021-05-31 ENCOUNTER — Other Ambulatory Visit: Payer: BLUE CROSS/BLUE SHIELD

## 2021-05-31 ENCOUNTER — Other Ambulatory Visit: Payer: BLUE CROSS/BLUE SHIELD | Admitting: Obstetrics & Gynecology

## 2021-05-31 ENCOUNTER — Encounter: Payer: BLUE CROSS/BLUE SHIELD | Admitting: Obstetrics & Gynecology

## 2021-06-28 ENCOUNTER — Encounter: Payer: Self-pay | Admitting: Women's Health

## 2021-06-28 ENCOUNTER — Other Ambulatory Visit: Payer: Self-pay

## 2021-06-28 ENCOUNTER — Ambulatory Visit (INDEPENDENT_AMBULATORY_CARE_PROVIDER_SITE_OTHER): Payer: BLUE CROSS/BLUE SHIELD | Admitting: Women's Health

## 2021-06-28 ENCOUNTER — Other Ambulatory Visit: Payer: BLUE CROSS/BLUE SHIELD

## 2021-06-28 VITALS — BP 122/77 | HR 93 | Wt 236.0 lb

## 2021-06-28 DIAGNOSIS — Z3493 Encounter for supervision of normal pregnancy, unspecified, third trimester: Secondary | ICD-10-CM | POA: Diagnosis not present

## 2021-06-28 DIAGNOSIS — Z23 Encounter for immunization: Secondary | ICD-10-CM

## 2021-06-28 DIAGNOSIS — Z3A29 29 weeks gestation of pregnancy: Secondary | ICD-10-CM

## 2021-06-28 NOTE — Progress Notes (Signed)
    LOW-RISK PREGNANCY VISIT Patient name: Erica Williams MRN [id number]  Date of birth: [birth year] Chief Complaint:   Routine Prenatal Visit (PN2 today)  History of Present Illness:   Mackensi Mahadeo is a 25 y.o. G2P0010 female at [redacted]w[redacted]d with an Estimated Date of Delivery: 09/07/21 being seen today for ongoing management of a low-risk pregnancy.   Today she reports no complaints. Contractions: Not present. Vag. Bleeding: None.  Movement: Present. denies leaking of fluid.  Depression screen [facility] 2/9 06/28/2021 02/24/2021  Decreased Interest 0 0  Down, Depressed, Hopeless 0 0  PHQ - 2 Score 0 0  Altered sleeping 0 0  Tired, decreased energy 0 0  Change in appetite 0 0  Feeling bad or failure about yourself  0 0  Trouble concentrating 0 0  Moving slowly or fidgety/restless 0 0  Suicidal thoughts 0 0  PHQ-9 Score 0 0     GAD 7 : Generalized Anxiety Score 06/28/2021 02/24/2021  Nervous, Anxious, on Edge 0 0  Control/stop worrying 0 0  Worry too much - different things 0 0  Trouble relaxing 0 0  Restless 0 0  Easily annoyed or irritable 0 0  Afraid - awful might happen 0 0  Total GAD 7 Score 0 0      Review of Systems:   Pertinent items are noted in HPI Denies abnormal vaginal discharge w/ itching/odor/irritation, headaches, visual changes, shortness of breath, chest pain, abdominal pain, severe nausea/vomiting, or problems with urination or bowel movements unless otherwise stated above. Pertinent History Reviewed:  Reviewed past medical,surgical, social, obstetrical and family history.  Reviewed problem list, medications and allergies. Physical Assessment:   Vitals:   06/28/21 0918  BP: 122/77  Pulse: 93  Weight: 236 lb (107 kg)  Body mass index is 39.27 kg/m.        Physical Examination:   General appearance: Well appearing, and in no distress  Mental status: Alert, oriented to person, place, and time  Skin: Warm & dry  Cardiovascular: Normal heart rate  noted  Respiratory: Normal respiratory effort, no distress  Abdomen: Soft, gravid, nontender  Pelvic: Cervical exam deferred         Extremities: Edema: None  Fetal Status: Fetal Heart Rate (bpm): 140 Fundal Height: 30 cm Movement: Present    Chaperone: N/A   No results found for this or any previous visit (from the past 24 hour(s)).  Assessment & Plan:  1) Low-risk pregnancy G2P0010 at [redacted]w[redacted]d with an Estimated Date of Delivery: 09/07/21    Meds: No orders of the defined types were placed in this encounter.  Labs/procedures today: tdap and PN2  Plan:  Continue routine obstetrical care  Next visit: prefers in person    Reviewed: Preterm labor symptoms and general obstetric precautions including but not limited to vaginal bleeding, contractions, leaking of fluid and fetal movement were reviewed in detail with the patient.  All questions were answered. Does have home bp cuff. Office bp cuff given: not applicable. Check bp weekly, let us know if consistently >140 and/or >90.  Follow-up: Return in about 2 weeks (around 07/12/2021) for LROB, [name], in person.  No future appointments.  No orders of the defined types were placed in this encounter.  Cheral Marker [name], [facility] 06/28/2021 9:37 AM

## 2021-06-28 NOTE — Patient Instructions (Signed)
Erica Williams, thank you for choosing our office today! We appreciate the opportunity to meet your healthcare needs. You may receive a short survey by mail, e-mail, or through MyChart. If you are happy with your care we would appreciate if you could take just a few minutes to complete the survey questions. We read all of your comments and take your feedback very seriously. Thank you again for choosing our office.  [facility]  [facility] ([address] [place], [place] [address]) Entrance C, located off of [address] Free 24/7 valet parking   CLASSES: Go to [email or web address] to register for classes (childbirth, breastfeeding, waterbirth, infant CPR, daddy bootcamp, etc.)  Call the office ([phone]) or go to [facility] if: You begin to have strong, frequent contractions Your water breaks.  Sometimes it is a big gush of fluid, sometimes it is just a trickle that keeps getting your panties wet or running down your legs You have vaginal bleeding.  It is normal to have a small amount of spotting if your cervix was checked.  You don't feel your baby moving like normal.  If you don't, get you something to eat and drink and lay down and focus on feeling your baby move.   If your baby is still not moving like normal, you should call the office or go to [facility].  Call the office ([phone]) or go to [facility] for these signs of pre-eclampsia: Severe headache that does not go away with Tylenol Visual changes- seeing spots, double, blurred vision Pain under your right breast or upper abdomen that does not go away with Tums or heartburn medicine Nausea and/or vomiting Severe swelling in your hands, feet, and face   Tdap Vaccine It is recommended that you get the Tdap vaccine during the third trimester of EACH pregnancy to help protect your baby from getting pertussis (whooping cough) 27-36 weeks is the BEST time to do  this so that you can pass the protection on to your baby. During pregnancy is better than after pregnancy, but if you are unable to get it during pregnancy it will be offered at the hospital.  You can get this vaccine with us, at the health department, your family doctor, or some local pharmacies Everyone who will be around your baby should also be up-to-date on their vaccines before the baby comes. Adults (who are not pregnant) only need 1 dose of Tdap during adulthood.   [place] Pediatricians/Family Doctors [facility] ([facility]): [address], [phone]           [facility]: [address], [phone]                 [facility] ([facility]): [address], [phone] (call to ask if accepting patients) [facility]: [address], [place], [phone]    [place] Pediatricians/Family Doctors [facility] ([facility]): [address], [phone] [facility]: [address], [phone] [facility]: [address], [phone]  [place] [facility]  [facility] ([facility]): [phone] [facility]: [address], [phone]   [place] Family Doctors [facility]: [address], [phone]  [place] [facility]  [facility]: [address], [phone]  Home Blood Pressure Monitoring for Patients   Your provider has recommended that you check your   blood pressure (BP) at least once a week at home. If you do not have a blood pressure cuff at home, one will be provided for you. Contact your provider if you have not received your monitor within 1 week.   Helpful Tips for Accurate Home Blood Pressure Checks  Don't smoke, exercise, or drink caffeine 30 minutes before checking your BP Use the restroom before checking your BP (a full bladder can raise your  pressure) Relax in a comfortable upright chair Feet on the ground Left arm resting comfortably on a flat surface at the level of your heart Legs uncrossed Back supported Sit quietly and don't talk Place the cuff on your bare arm Adjust snuggly, so that only two fingertips can fit between your skin and the top of the cuff Check 2 readings separated by at least one minute Keep a log of your BP readings For a visual, please reference this diagram: http://ccnc.care/bpdiagram  Provider Name: [facility]     Phone: [phone]  Zone 1: ALL CLEAR  Continue to monitor your symptoms:  BP reading is less than 140 (top number) or less than 90 (bottom number)  No right upper stomach pain No headaches or seeing spots No feeling nauseated or throwing up No swelling in face and hands  Zone 2: CAUTION Call your doctor's office for any of the following:  BP reading is greater than 140 (top number) or greater than 90 (bottom number)  Stomach pain under your ribs in the middle or right side Headaches or seeing spots Feeling nauseated or throwing up Swelling in face and hands  Zone 3: EMERGENCY  Seek immediate medical care if you have any of the following:  BP reading is greater than160 (top number) or greater than 110 (bottom number) Severe headaches not improving with Tylenol Serious difficulty catching your breath Any worsening symptoms from Zone 2   Third Trimester of Pregnancy The third trimester is from week 29 through week 42, months 7 through 9. The third trimester is a time when the fetus is growing rapidly. At the end of the ninth month, the fetus is about 20 inches in length and weighs 6-10 pounds.  BODY CHANGES Your body goes through many changes during pregnancy. The changes vary from woman to woman.  Your weight will continue to increase. You can expect to gain 25-35 pounds (11-16 kg) by the end of the pregnancy. You may begin to get stretch marks on your hips, abdomen,  and breasts. You may urinate more often because the fetus is moving lower into your pelvis and pressing on your bladder. You may develop or continue to have heartburn as a result of your pregnancy. You may develop constipation because certain hormones are causing the muscles that push waste through your intestines to slow down. You may develop hemorrhoids or swollen, bulging veins (varicose veins). You may have pelvic pain because of the weight gain and pregnancy hormones relaxing your joints between the bones in your pelvis. Backaches may result from overexertion of the muscles supporting your posture. You may have changes in your hair. These can include thickening of your hair, rapid growth, and changes in texture. Some women also have hair loss during or after pregnancy, or hair that feels dry or thin. Your hair will most likely return to normal after your baby is born. Your breasts will continue to grow and be tender. A yellow discharge may leak from your breasts called colostrum. Your belly button may stick out. You may   feel short of breath because of your expanding uterus. You may notice the fetus "dropping," or moving lower in your abdomen. You may have a bloody mucus discharge. This usually occurs a few days to a week before labor begins. Your cervix becomes thin and soft (effaced) near your due date. WHAT TO EXPECT AT YOUR PRENATAL EXAMS  You will have prenatal exams every 2 weeks until week 36. Then, you will have weekly prenatal exams. During a routine prenatal visit: You will be weighed to make sure you and the fetus are growing normally. Your blood pressure is taken. Your abdomen will be measured to track your baby's growth. The fetal heartbeat will be listened to. Any test results from the previous visit will be discussed. You may have a cervical check near your due date to see if you have effaced. At around 36 weeks, your caregiver will check your cervix. At the same time, your  caregiver will also perform a test on the secretions of the vaginal tissue. This test is to determine if a type of bacteria, Group B streptococcus, is present. Your caregiver will explain this further. Your caregiver may ask you: What your birth plan is. How you are feeling. If you are feeling the baby move. If you have had any abnormal symptoms, such as leaking fluid, bleeding, severe headaches, or abdominal cramping. If you have any questions. Other tests or screenings that may be performed during your third trimester include: Blood tests that check for low iron levels (anemia). Fetal testing to check the health, activity level, and growth of the fetus. Testing is done if you have certain medical conditions or if there are problems during the pregnancy. FALSE LABOR You may feel small, irregular contractions that eventually go away. These are called Braxton Hicks contractions, or false labor. Contractions may last for hours, days, or even weeks before true labor sets in. If contractions come at regular intervals, intensify, or become painful, it is best to be seen by your caregiver.  SIGNS OF LABOR  Menstrual-like cramps. Contractions that are 5 minutes apart or less. Contractions that start on the top of the uterus and spread down to the lower abdomen and back. A sense of increased pelvic pressure or back pain. A watery or bloody mucus discharge that comes from the vagina. If you have any of these signs before the 37th week of pregnancy, call your caregiver right away. You need to go to the hospital to get checked immediately. HOME CARE INSTRUCTIONS  Avoid all smoking, herbs, alcohol, and unprescribed drugs. These chemicals affect the formation and growth of the baby. Follow your caregiver's instructions regarding medicine use. There are medicines that are either safe or unsafe to take during pregnancy. Exercise only as directed by your caregiver. Experiencing uterine cramps is a good sign to  stop exercising. Continue to eat regular, healthy meals. Wear a good support bra for breast tenderness. Do not use hot tubs, steam rooms, or saunas. Wear your seat belt at all times when driving. Avoid raw meat, uncooked cheese, cat litter boxes, and soil used by cats. These carry germs that can cause birth defects in the baby. Take your prenatal vitamins. Try taking a stool softener (if your caregiver approves) if you develop constipation. Eat more high-fiber foods, such as fresh vegetables or fruit and whole grains. Drink plenty of fluids to keep your urine clear or pale yellow. Take warm sitz baths to soothe any pain or discomfort caused by hemorrhoids. Use hemorrhoid cream if   your caregiver approves. If you develop varicose veins, wear support hose. Elevate your feet for 15 minutes, 3-4 times a day. Limit salt in your diet. Avoid heavy lifting, wear low heal shoes, and practice good posture. Rest a lot with your legs elevated if you have leg cramps or low back pain. Visit your dentist if you have not gone during your pregnancy. Use a soft toothbrush to brush your teeth and be gentle when you floss. A sexual relationship may be continued unless your caregiver directs you otherwise. Do not travel far distances unless it is absolutely necessary and only with the approval of your caregiver. Take prenatal classes to understand, practice, and ask questions about the labor and delivery. Make a trial run to the hospital. Pack your hospital bag. Prepare the baby's nursery. Continue to go to all your prenatal visits as directed by your caregiver. SEEK MEDICAL CARE IF: You are unsure if you are in labor or if your water has broken. You have dizziness. You have mild pelvic cramps, pelvic pressure, or nagging pain in your abdominal area. You have persistent nausea, vomiting, or diarrhea. You have a bad smelling vaginal discharge. You have pain with urination. SEEK IMMEDIATE MEDICAL CARE IF:  You  have a fever. You are leaking fluid from your vagina. You have spotting or bleeding from your vagina. You have severe abdominal cramping or pain. You have rapid weight loss or gain. You have shortness of breath with chest pain. You notice sudden or extreme swelling of your face, hands, ankles, feet, or legs. You have not felt your baby move in over an hour. You have severe headaches that do not go away with medicine. You have vision changes. Document Released: 11/22/2001 Document Revised: 12/03/2013 Document Reviewed: 01/29/2013 ExitCare Patient Information 2015 [employer]. This information is not intended to replace advice given to you by your health care provider. Make sure you discuss any questions you have with your health care provider.       

## 2021-06-29 ENCOUNTER — Other Ambulatory Visit: Payer: Self-pay | Admitting: Women's Health

## 2021-06-29 DIAGNOSIS — Z3493 Encounter for supervision of normal pregnancy, unspecified, third trimester: Secondary | ICD-10-CM

## 2021-06-29 LAB — CBC
Hematocrit: 31.3 % — ABNORMAL LOW (ref 34.0–46.6)
Hemoglobin: 10.7 g/dL — ABNORMAL LOW (ref 11.1–15.9)
MCH: 29.9 pg (ref 26.6–33.0)
MCHC: 34.2 g/dL (ref 31.5–35.7)
MCV: 87 fL (ref 79–97)
Platelets: 256 10*3/uL (ref 150–450)
RBC: 3.58 x10E6/uL — ABNORMAL LOW (ref 3.77–5.28)
RDW: 12.3 % (ref 11.7–15.4)
WBC: 11 10*3/uL — ABNORMAL HIGH (ref 3.4–10.8)

## 2021-06-29 LAB — GLUCOSE TOLERANCE, 2 HOURS W/ 1HR
Glucose, 1 hour: 166 mg/dL (ref 65–179)
Glucose, 2 hour: 141 mg/dL (ref 65–152)
Glucose, Fasting: 80 mg/dL (ref 65–91)

## 2021-06-29 LAB — HIV ANTIBODY (ROUTINE TESTING W REFLEX): HIV Screen 4th Generation wRfx: NONREACTIVE

## 2021-06-29 LAB — ANTIBODY SCREEN: Antibody Screen: NEGATIVE

## 2021-06-29 LAB — RPR: RPR Ser Ql: NONREACTIVE

## 2021-06-29 MED ORDER — FERROUS SULFATE 325 (65 FE) MG PO TABS: 325.0000 mg | ORAL_TABLET | ORAL | 2 refills | Status: DC

## 2021-06-30 ENCOUNTER — Telehealth: Payer: Self-pay

## 2021-06-30 NOTE — Telephone Encounter (Signed)
Called pt to relay [employer] msg since she hadn't read it yet. Pt confirmed understanding.

## 2021-07-12 ENCOUNTER — Other Ambulatory Visit: Payer: Self-pay

## 2021-07-12 ENCOUNTER — Ambulatory Visit (INDEPENDENT_AMBULATORY_CARE_PROVIDER_SITE_OTHER): Payer: BLUE CROSS/BLUE SHIELD | Admitting: Obstetrics & Gynecology

## 2021-07-12 ENCOUNTER — Encounter: Payer: Self-pay | Admitting: Obstetrics & Gynecology

## 2021-07-12 VITALS — BP 125/84 | HR 108 | Wt 236.5 lb

## 2021-07-12 DIAGNOSIS — Z3493 Encounter for supervision of normal pregnancy, unspecified, third trimester: Secondary | ICD-10-CM

## 2021-07-12 DIAGNOSIS — Z3A31 31 weeks gestation of pregnancy: Secondary | ICD-10-CM

## 2021-07-12 NOTE — Progress Notes (Signed)
   LOW-RISK PREGNANCY VISIT Patient name: Erica Williams MRN [id number]  Date of birth: 1996-03-08 Chief Complaint:   Routine Prenatal Visit  History of Present Illness:   Erica Williams is a 25 y.o. G2P0010 female at [redacted]w[redacted]d with an Estimated Date of Delivery: 09/07/21 being seen today for ongoing management of a low-risk pregnancy.  Depression screen [facility] 2/9 06/28/2021 02/24/2021  Decreased Interest 0 0  Down, Depressed, Hopeless 0 0  PHQ - 2 Score 0 0  Altered sleeping 0 0  Tired, decreased energy 0 0  Change in appetite 0 0  Feeling bad or failure about yourself  0 0  Trouble concentrating 0 0  Moving slowly or fidgety/restless 0 0  Suicidal thoughts 0 0  PHQ-9 Score 0 0    Today she reports no complaints. Contractions: Not present. Vag. Bleeding: None.  Movement: Present. denies leaking of fluid. Review of Systems:   Pertinent items are noted in HPI Denies abnormal vaginal discharge w/ itching/odor/irritation, headaches, visual changes, shortness of breath, chest pain, abdominal pain, severe nausea/vomiting, or problems with urination or bowel movements unless otherwise stated above. Pertinent History Reviewed:  Reviewed past medical,surgical, social, obstetrical and family history.  Reviewed problem list, medications and allergies. Physical Assessment:   Vitals:   07/12/21 1413  BP: 125/84  Pulse: (!) 108  Weight: 236 lb 8 oz (107.3 kg)  Body mass index is 39.36 kg/m.        Physical Examination:   General appearance: Well appearing, and in no distress  Mental status: Alert, oriented to person, place, and time  Skin: Warm & dry  Cardiovascular: Normal heart rate noted  Respiratory: Normal respiratory effort, no distress  Abdomen: Soft, gravid, nontender  Pelvic: Cervical exam deferred         Extremities: Edema: None  Fetal Status: Fetal Heart Rate (bpm): 140 Fundal Height: 33 cm Movement: Present    Chaperone: n/a    No results found for this or any previous visit  (from the past 24 hour(s)).  Assessment & Plan:  1) Low-risk pregnancy G2P0010 at [redacted]w[redacted]d with an Estimated Date of Delivery: 09/07/21      Meds: No orders of the defined types were placed in this encounter.  Labs/procedures today:   Plan:  Continue routine obstetrical care  Next visit: prefers in person    Reviewed: Preterm labor symptoms and general obstetric precautions including but not limited to vaginal bleeding, contractions, leaking of fluid and fetal movement were reviewed in detail with the patient.  All questions were answered. Has home bp cuff. Rx faxed to . Check bp weekly, let us know if >140/90.   Follow-up: Return in about 3 weeks (around 08/02/2021) for LROB.  No orders of the defined types were placed in this encounter.   Lazaro Arms, MD 07/12/2021 2:28 PM

## 2021-08-03 ENCOUNTER — Encounter: Payer: Self-pay | Admitting: Obstetrics & Gynecology

## 2021-08-03 ENCOUNTER — Telehealth: Payer: BLUE CROSS/BLUE SHIELD | Admitting: Obstetrics & Gynecology

## 2021-08-03 VITALS — BP 123/78 | HR 82

## 2021-08-03 DIAGNOSIS — Z3493 Encounter for supervision of normal pregnancy, unspecified, third trimester: Secondary | ICD-10-CM

## 2021-08-03 NOTE — Progress Notes (Unsigned)
   LOW-RISK PREGNANCY VISIT Patient name: Erica Williams MRN [id number]  Date of birth: 04/09/1996 Chief Complaint:   Routine Prenatal Visit  History of Present Illness:   Erica Williams is a 25 y.o. G2P0010 female at [redacted]w[redacted]d with an Estimated Date of Delivery: 09/07/21 being seen today for ongoing management of a low-risk pregnancy.  Depression screen [facility] 2/9 06/28/2021 02/24/2021  Decreased Interest 0 0  Down, Depressed, Hopeless 0 0  PHQ - 2 Score 0 0  Altered sleeping 0 0  Tired, decreased energy 0 0  Change in appetite 0 0  Feeling bad or failure about yourself  0 0  Trouble concentrating 0 0  Moving slowly or fidgety/restless 0 0  Suicidal thoughts 0 0  PHQ-9 Score 0 0    Today she reports {sx:14538}. Contractions: Not present. Vag. Bleeding: None.  Movement: Present. {Actions; denies-reports:120008} leaking of fluid. Review of Systems:   Pertinent items are noted in HPI Denies abnormal vaginal discharge w/ itching/odor/irritation, headaches, visual changes, shortness of breath, chest pain, abdominal pain, severe nausea/vomiting, or problems with urination or bowel movements unless otherwise stated above. Pertinent History Reviewed:  Reviewed past medical,surgical, social, obstetrical and family history.  Reviewed problem list, medications and allergies. Physical Assessment:   Vitals:   08/03/21 1031  BP: 123/78  Pulse: 82  There is no height or weight on file to calculate BMI.        Physical Examination:   General appearance: Well appearing, and in no distress  Mental status: Alert, oriented to person, place, and time  Skin: Warm & dry  Cardiovascular: Normal heart rate noted  Respiratory: Normal respiratory effort, no distress  Abdomen: Soft, gravid, nontender  Pelvic: {Blank single:19197::"Cervical exam performed","Cervical exam deferred"}         Extremities: Edema: None  Fetal Status:     Movement: Present    Chaperone: {Blank single:19197::"n/a","Amanda  Rash","Janet Young","Latisha Cresenzo","Peggy Dones","Sabrina Holland"}    No results found for this or any previous visit (from the past 24 hour(s)).  Assessment & Plan:  1) Low-risk pregnancy G2P0010 at [redacted]w[redacted]d with an Estimated Date of Delivery: 09/07/21   2) ***, ***   Meds: No orders of the defined types were placed in this encounter.  Labs/procedures today: ***  Plan:  Continue routine obstetrical care *** Next visit: prefers {Blank single:19197::"in person","online","will be in person for ***"}    Reviewed: {Blank single:19197::"Term","Preterm"} labor symptoms and general obstetric precautions including but not limited to vaginal bleeding, contractions, leaking of fluid and fetal movement were reviewed in detail with the patient.  All questions were answered. *** home bp cuff. Rx faxed to ***. Check bp weekly, let us know if >140/90.   Follow-up: No follow-ups on file.  No orders of the defined types were placed in this encounter.   Lazaro Arms, MD 08/03/2021 11:20 AM

## 2021-08-11 ENCOUNTER — Ambulatory Visit (INDEPENDENT_AMBULATORY_CARE_PROVIDER_SITE_OTHER): Payer: BLUE CROSS/BLUE SHIELD | Admitting: Advanced Practice Midwife

## 2021-08-11 ENCOUNTER — Encounter: Payer: Self-pay | Admitting: Advanced Practice Midwife

## 2021-08-11 ENCOUNTER — Other Ambulatory Visit: Payer: Self-pay

## 2021-08-11 VITALS — BP 125/84 | HR 94 | Wt 242.0 lb

## 2021-08-11 DIAGNOSIS — F1290 Cannabis use, unspecified, uncomplicated: Secondary | ICD-10-CM

## 2021-08-11 DIAGNOSIS — Z3493 Encounter for supervision of normal pregnancy, unspecified, third trimester: Secondary | ICD-10-CM

## 2021-08-11 DIAGNOSIS — O26843 Uterine size-date discrepancy, third trimester: Secondary | ICD-10-CM

## 2021-08-11 DIAGNOSIS — Z3A36 36 weeks gestation of pregnancy: Secondary | ICD-10-CM

## 2021-08-11 DIAGNOSIS — O26849 Uterine size-date discrepancy, unspecified trimester: Secondary | ICD-10-CM | POA: Insufficient documentation

## 2021-08-11 NOTE — Progress Notes (Signed)
   LOW-RISK PREGNANCY VISIT Patient name: Erica Williams MRN [id number]  Date of birth: 04-09-1996 Chief Complaint:   Routine Prenatal Visit  History of Present Illness:   Erica Williams is a 25 y.o. G2P0010 female at [redacted]w[redacted]d with an Estimated Date of Delivery: 09/07/21 being seen today for ongoing management of a low-risk pregnancy.  Today she reports no complaints. Contractions: Not present. Vag. Bleeding: None.  Movement: Present. denies leaking of fluid. Review of Systems:   Pertinent items are noted in HPI Denies abnormal vaginal discharge w/ itching/odor/irritation, headaches, visual changes, shortness of breath, chest pain, abdominal pain, severe nausea/vomiting, or problems with urination or bowel movements unless otherwise stated above. Pertinent History Reviewed:  Reviewed past medical,surgical, social, obstetrical and family history.  Reviewed problem list, medications and allergies. Physical Assessment:   Vitals:   08/11/21 1625  BP: 125/84  Pulse: 94  Weight: 242 lb (109.8 kg)  Body mass index is 40.27 kg/m.        Physical Examination:   General appearance: Well appearing, and in no distress  Mental status: Alert, oriented to person, place, and time  Skin: Warm & dry  Cardiovascular: Normal heart rate noted  Respiratory: Normal respiratory effort, no distress  Abdomen: Soft, gravid, nontender  Pelvic: Cervical exam deferred         Extremities: Edema: None  Fetal Status: Fetal Heart Rate (bpm): 134 Fundal Height: 39 cm Movement: Present    No results found for this or any previous visit (from the past 24 hour(s)).  Assessment & Plan:  1) Low-risk pregnancy G2P0010 at [redacted]w[redacted]d with an Estimated Date of Delivery: 09/07/21   2) Uterine S>D, will get [place] for EFW next visit  3) +THC at new OB visit, will recheck today to show neg result   Meds: No orders of the defined types were placed in this encounter.  Labs/procedures today: GC/chlam & UDS  Plan:  Continue routine  obstetrical care   Reviewed: Preterm labor symptoms and general obstetric precautions including but not limited to vaginal bleeding, contractions, leaking of fluid and fetal movement were reviewed in detail with the patient.  All questions were answered. Has home bp cuff. Check bp weekly, let us know if >140/90.   Follow-up: Return in about 1 week (around 08/18/2021) for LROB, in person, [place]: EFW.  Orders Placed This Encounter  Procedures   US OB Follow Up   Arabella Merles [facility] 08/11/2021 4:36 PM

## 2021-08-12 LAB — PMP SCREEN PROFILE (10S), URINE
Amphetamine Scrn, Ur: NEGATIVE ng/mL
BARBITURATE SCREEN URINE: NEGATIVE ng/mL
BENZODIAZEPINE SCREEN, URINE: NEGATIVE ng/mL
CANNABINOIDS UR QL SCN: NEGATIVE ng/mL
Cocaine (Metab) Scrn, Ur: NEGATIVE ng/mL
Creatinine(Crt), U: 200.7 mg/dL (ref 20.0–300.0)
Methadone Screen, Urine: NEGATIVE ng/mL
OXYCODONE+OXYMORPHONE UR QL SCN: NEGATIVE ng/mL
Opiate Scrn, Ur: NEGATIVE ng/mL
Ph of Urine: 6.2 (ref 4.5–8.9)
Phencyclidine Qn, Ur: NEGATIVE ng/mL
Propoxyphene Scrn, Ur: NEGATIVE ng/mL

## 2021-08-24 ENCOUNTER — Other Ambulatory Visit: Payer: Self-pay

## 2021-08-24 ENCOUNTER — Ambulatory Visit (INDEPENDENT_AMBULATORY_CARE_PROVIDER_SITE_OTHER): Payer: Medicaid Other | Admitting: Obstetrics & Gynecology

## 2021-08-24 ENCOUNTER — Encounter: Payer: Self-pay | Admitting: Obstetrics & Gynecology

## 2021-08-24 ENCOUNTER — Encounter: Payer: Self-pay | Admitting: Women's Health

## 2021-08-24 ENCOUNTER — Other Ambulatory Visit: Payer: BLUE CROSS/BLUE SHIELD

## 2021-08-24 ENCOUNTER — Ambulatory Visit (INDEPENDENT_AMBULATORY_CARE_PROVIDER_SITE_OTHER): Payer: Medicaid Other

## 2021-08-24 VITALS — BP 131/90 | HR 90 | Wt 245.5 lb

## 2021-08-24 DIAGNOSIS — O26843 Uterine size-date discrepancy, third trimester: Secondary | ICD-10-CM

## 2021-08-24 DIAGNOSIS — Z3493 Encounter for supervision of normal pregnancy, unspecified, third trimester: Secondary | ICD-10-CM

## 2021-08-24 DIAGNOSIS — Z3A38 38 weeks gestation of pregnancy: Secondary | ICD-10-CM | POA: Diagnosis not present

## 2021-08-24 DIAGNOSIS — Z23 Encounter for immunization: Secondary | ICD-10-CM | POA: Diagnosis not present

## 2021-08-24 NOTE — Progress Notes (Signed)
   LOW-RISK PREGNANCY VISIT Patient name: Erica Williams MRN [id number]  Date of birth: 09/19/1996 Chief Complaint:   Routine Prenatal Visit ([place] today)  History of Present Illness:   Erica Williams is a 25 y.o. G2P0010 female at [redacted]w[redacted]d with an Estimated Date of Delivery: 09/07/21 being seen today for ongoing management of a low-risk pregnancy.   Concern for LGA- [place] completed today- EFW 3648g (83%)  Depression screen [facility] 2/9 06/28/2021 02/24/2021  Decreased Interest 0 0  Down, Depressed, Hopeless 0 0  PHQ - 2 Score 0 0  Altered sleeping 0 0  Tired, decreased energy 0 0  Change in appetite 0 0  Feeling bad or failure about yourself  0 0  Trouble concentrating 0 0  Moving slowly or fidgety/restless 0 0  Suicidal thoughts 0 0  PHQ-9 Score 0 0    Today she reports no complaints. Contractions: Irregular. Vag. Bleeding: None.  Movement: Present. denies leaking of fluid. Review of Systems:   Pertinent items are noted in HPI Denies abnormal vaginal discharge w/ itching/odor/irritation, headaches, visual changes, shortness of breath, chest pain, abdominal pain, severe nausea/vomiting, or problems with urination or bowel movements unless otherwise stated above. Pertinent History Reviewed:  Reviewed past medical,surgical, social, obstetrical and family history.  Reviewed problem list, medications and allergies.  Physical Assessment:   Vitals:   08/24/21 1135  BP: 131/90  Pulse: 90  Weight: 245 lb 8 oz (111.4 kg)  Body mass index is 40.85 kg/m.        Physical Examination:   General appearance: Well appearing, and in no distress  Mental status: Alert, oriented to person, place, and time  Skin: Warm & dry  Respiratory: Normal respiratory effort, no distress  Abdomen: Soft, gravid, nontender  Pelvic: Cervical exam deferred         Extremities: Edema: Trace  Psych:  mood and affect appropriate  Fetal Status: Fetal Heart Rate (bpm): 137 u/s   Movement: Present    Chaperone: n/a     No results found for this or any previous visit (from the past 24 hour(s)).   Assessment & Plan:  1) Low-risk pregnancy G2P0010 at [redacted]w[redacted]d with an Estimated Date of Delivery: 09/07/21   2) Suspected LGA- [place] completed today- cephalic,posterior placenta gr 3,AFI 14 cm,FHR 137 bpm,efw 3648 g 83%   Meds: No orders of the defined types were placed in this encounter.  Labs/procedures today: Growth scan  Plan:  Continue routine obstetrical care Next visit: prefers in person    Reviewed: Term labor symptoms and general obstetric precautions including but not limited to vaginal bleeding, contractions, leaking of fluid and fetal movement were reviewed in detail with the patient.  All questions were answered. Pt has home bp cuff. Check bp weekly, let us know if >140/90.   Follow-up: Return in about 1 week (around 08/31/2021) for LROB visit.  Orders Placed This Encounter  Procedures   Flu Vaccine QUAD 7mo+IM (Fluarix, Fluzone & Alfiuria Quad PF)    Myna Hidalgo, DO Attending Obstetrician & Gynecologist, [facility] Center for [employer], [facility]

## 2021-08-24 NOTE — Progress Notes (Signed)
[place] 38 wks,cephalic,posterior placenta gr 3,AFI 14 cm,FHR 137 bpm,efw 3648 g 83%

## 2021-08-28 ENCOUNTER — Other Ambulatory Visit: Payer: Self-pay

## 2021-08-28 ENCOUNTER — Inpatient Hospital Stay (HOSPITAL_COMMUNITY)
Admission: AD | Admit: 2021-08-28 | Discharge: 2021-09-01 | DRG: 806 | Disposition: A | Discharge status: Home / Self Care | Payer: Medicaid Other | Attending: Obstetrics and Gynecology | Admitting: Obstetrics and Gynecology

## 2021-08-28 ENCOUNTER — Encounter (HOSPITAL_COMMUNITY): Payer: Self-pay | Admitting: Obstetrics & Gynecology

## 2021-08-28 DIAGNOSIS — R8271 Bacteriuria: Secondary | ICD-10-CM | POA: Diagnosis present

## 2021-08-28 DIAGNOSIS — Z7982 Long term (current) use of aspirin: Secondary | ICD-10-CM

## 2021-08-28 DIAGNOSIS — O99214 Obesity complicating childbirth: Secondary | ICD-10-CM | POA: Diagnosis present

## 2021-08-28 DIAGNOSIS — Z3480 Encounter for supervision of other normal pregnancy, unspecified trimester: Secondary | ICD-10-CM

## 2021-08-28 DIAGNOSIS — O9832 Other infections with a predominantly sexual mode of transmission complicating childbirth: Secondary | ICD-10-CM | POA: Diagnosis present

## 2021-08-28 DIAGNOSIS — O99824 Streptococcus B carrier state complicating childbirth: Secondary | ICD-10-CM | POA: Diagnosis present

## 2021-08-28 DIAGNOSIS — Z20822 Contact with and (suspected) exposure to covid-19: Secondary | ICD-10-CM | POA: Diagnosis present

## 2021-08-28 DIAGNOSIS — O134 Gestational [pregnancy-induced] hypertension without significant proteinuria, complicating childbirth: Secondary | ICD-10-CM | POA: Diagnosis present

## 2021-08-28 DIAGNOSIS — A5602 Chlamydial vulvovaginitis: Secondary | ICD-10-CM | POA: Diagnosis present

## 2021-08-28 DIAGNOSIS — O26843 Uterine size-date discrepancy, third trimester: Secondary | ICD-10-CM

## 2021-08-28 DIAGNOSIS — Z3A38 38 weeks gestation of pregnancy: Secondary | ICD-10-CM

## 2021-08-28 DIAGNOSIS — R030 Elevated blood-pressure reading, without diagnosis of hypertension: Secondary | ICD-10-CM | POA: Diagnosis present

## 2021-08-28 DIAGNOSIS — Z3490 Encounter for supervision of normal pregnancy, unspecified, unspecified trimester: Secondary | ICD-10-CM

## 2021-08-28 DIAGNOSIS — R87619 Unspecified abnormal cytological findings in specimens from cervix uteri: Secondary | ICD-10-CM | POA: Diagnosis present

## 2021-08-28 DIAGNOSIS — O4292 Full-term premature rupture of membranes, unspecified as to length of time between rupture and onset of labor: Principal | ICD-10-CM | POA: Diagnosis present

## 2021-08-28 NOTE — MAU Note (Signed)
Pt reported additional leaking-fern collected

## 2021-08-28 NOTE — MAU Note (Signed)
8:20 pm leaking clear fluid, had to change underwear few times.  Baby moving well. No bleeding. Contractions little discomfort.

## 2021-08-28 NOTE — MAU Provider Note (Signed)
S: Erica Williams is a 25 y.o. G2P0010 at [redacted]w[redacted]d  who presents to MAU today complaining of leaking of fluid since 2020. She denies vaginal bleeding. She endorses contractions, but states they are minimal. She reports normal fetal movement.    O: BP 124/85 (BP Location: Right Arm)   Pulse 89   Temp 98.1 F (36.7 C) (Oral)   Resp 18   Ht 5\' 5"  (1.651 m)   Wt 112.5 kg   LMP 12/01/2020 (Exact Date)   SpO2 100%   BMI 41.27 kg/m  GENERAL: Well-developed, well-nourished female in no acute distress.  HEAD: Normocephalic, atraumatic.  CHEST: Normal effort of breathing, regular heart rate ABDOMEN: Soft, nontender, gravid PELVIC: Normal external female genitalia. Vagina is pink and rugated. Cervix with normal contour, no lesions. Normal discharge.  Positive pooling. Fern Collected.  Cervical exam:   Deferred  Patient informed that the ultrasound is considered a limited OB ultrasound and is not intended to be a complete ultrasound exam.  Patient also informed that the ultrasound is not being completed with the intent of assessing for fetal or placental anomalies or any pelvic abnormalities.  Explained that the purpose of today's ultrasound is to assess for  presentation.  Patient acknowledges the purpose of the exam and the limitations of the study.  Fetal Monitoring: FHT: 150 bpm, Mod Var, -Decels, +Accels Toco: Occ. Mild ctx graphed  No results found for this or any previous visit (from the past 24 hour(s)).   A: [facility] at [redacted]w[redacted]d  SROM Cat I FT  P: Fern positive BSUS confirms cephalic presentations Report given to RN to contact L&D team further instructions  [redacted]w[redacted]d, [name] 08/28/2021 11:34 PM

## 2021-08-28 NOTE — MAU Note (Signed)
1st fern negative - [name] notified for confirmation-pt not wearing pad-states she has had no additional leaking since she left her house.

## 2021-08-29 ENCOUNTER — Inpatient Hospital Stay (HOSPITAL_COMMUNITY): Payer: Medicaid Other | Admitting: Anesthesiology

## 2021-08-29 ENCOUNTER — Other Ambulatory Visit: Payer: Self-pay

## 2021-08-29 ENCOUNTER — Encounter (HOSPITAL_COMMUNITY): Payer: Self-pay | Admitting: Obstetrics & Gynecology

## 2021-08-29 DIAGNOSIS — O99214 Obesity complicating childbirth: Secondary | ICD-10-CM | POA: Diagnosis present

## 2021-08-29 DIAGNOSIS — A5602 Chlamydial vulvovaginitis: Secondary | ICD-10-CM | POA: Diagnosis present

## 2021-08-29 DIAGNOSIS — Z3480 Encounter for supervision of other normal pregnancy, unspecified trimester: Secondary | ICD-10-CM

## 2021-08-29 DIAGNOSIS — Z7982 Long term (current) use of aspirin: Secondary | ICD-10-CM | POA: Diagnosis not present

## 2021-08-29 DIAGNOSIS — Z3A38 38 weeks gestation of pregnancy: Secondary | ICD-10-CM | POA: Diagnosis not present

## 2021-08-29 DIAGNOSIS — O4292 Full-term premature rupture of membranes, unspecified as to length of time between rupture and onset of labor: Secondary | ICD-10-CM | POA: Diagnosis present

## 2021-08-29 DIAGNOSIS — O9832 Other infections with a predominantly sexual mode of transmission complicating childbirth: Secondary | ICD-10-CM | POA: Diagnosis present

## 2021-08-29 DIAGNOSIS — Z20822 Contact with and (suspected) exposure to covid-19: Secondary | ICD-10-CM | POA: Diagnosis present

## 2021-08-29 DIAGNOSIS — O99824 Streptococcus B carrier state complicating childbirth: Secondary | ICD-10-CM | POA: Diagnosis present

## 2021-08-29 DIAGNOSIS — O134 Gestational [pregnancy-induced] hypertension without significant proteinuria, complicating childbirth: Secondary | ICD-10-CM | POA: Diagnosis present

## 2021-08-29 LAB — URINALYSIS, ROUTINE W REFLEX MICROSCOPIC
Bilirubin Urine: NEGATIVE
Glucose, UA: NEGATIVE mg/dL
Hgb urine dipstick: NEGATIVE
Ketones, ur: NEGATIVE mg/dL
Nitrite: NEGATIVE
Protein, ur: 30 mg/dL — AB
Specific Gravity, Urine: 1.006 (ref 1.005–1.030)
pH: 6 (ref 5.0–8.0)

## 2021-08-29 LAB — COMPREHENSIVE METABOLIC PANEL
ALT: 13 U/L (ref 0–44)
AST: 20 U/L (ref 15–41)
Albumin: 3 g/dL — ABNORMAL LOW (ref 3.5–5.0)
Alkaline Phosphatase: 111 U/L (ref 38–126)
Anion gap: 10 (ref 5–15)
BUN: 5 mg/dL — ABNORMAL LOW (ref 6–20)
CO2: 19 mmol/L — ABNORMAL LOW (ref 22–32)
Calcium: 9.3 mg/dL (ref 8.9–10.3)
Chloride: 106 mmol/L (ref 98–111)
Creatinine, Ser: 0.55 mg/dL (ref 0.44–1.00)
GFR, Estimated: >60 mL/min (ref >60)
Glucose, Bld: 84 mg/dL (ref 70–99)
Potassium: 3.7 mmol/L (ref 3.5–5.1)
Sodium: 135 mmol/L (ref 135–145)
Total Bilirubin: 0.3 mg/dL (ref 0.3–1.2)
Total Protein: 6.9 g/dL (ref 6.5–8.1)

## 2021-08-29 LAB — PROTEIN / CREATININE RATIO, URINE
Creatinine, Urine: 80.32 mg/dL
Protein Creatinine Ratio: 0.54 mg/mg{Cre} — ABNORMAL HIGH (ref 0.00–0.15)
Total Protein, Urine: 43 mg/dL

## 2021-08-29 LAB — RPR: RPR Ser Ql: NONREACTIVE

## 2021-08-29 LAB — TYPE AND SCREEN
ABO/RH(D): A POS
Antibody Screen: NEGATIVE

## 2021-08-29 LAB — CBC
HCT: 33.6 % — ABNORMAL LOW (ref 36.0–46.0)
Hemoglobin: 11.2 g/dL — ABNORMAL LOW (ref 12.0–15.0)
MCH: 28.5 pg (ref 26.0–34.0)
MCHC: 33.3 g/dL (ref 30.0–36.0)
MCV: 85.5 fL (ref 80.0–100.0)
Platelets: 230 10*3/uL (ref 150–400)
RBC: 3.93 MIL/uL (ref 3.87–5.11)
RDW: 12.7 % (ref 11.5–15.5)
WBC: 10.3 10*3/uL (ref 4.0–10.5)
nRBC: 0 % (ref 0.0–0.2)

## 2021-08-29 LAB — POCT FERN TEST: POCT Fern Test: POSITIVE

## 2021-08-29 LAB — RESP PANEL BY RT-PCR (FLU A&B, COVID) ARPGX2
Influenza A by PCR: NEGATIVE
Influenza B by PCR: NEGATIVE
SARS Coronavirus 2 by RT PCR: NEGATIVE

## 2021-08-29 MED ORDER — LACTATED RINGERS IV SOLN
INTRAVENOUS | Status: DC
Start: 2021-08-29 — End: 2021-08-31
  Administered 2021-08-29: 1000 mL via INTRAVENOUS

## 2021-08-29 MED ORDER — SODIUM CHLORIDE 0.9 % IV SOLN
5.0000 10*6.[IU] | Freq: Once | INTRAVENOUS | Status: AC
  Administered 2021-08-29: 5 10*6.[IU] via INTRAVENOUS
  Filled 2021-08-29: qty 5

## 2021-08-29 MED ORDER — FENTANYL-BUPIVACAINE-NACL 0.5-0.125-0.9 MG/250ML-% EP SOLN
12.0000 mL/h | EPIDURAL | Status: DC | PRN
  Administered 2021-08-29 – 2021-08-30 (×2): 12 mL/h via EPIDURAL
  Filled 2021-08-29 (×2): qty 250

## 2021-08-29 MED ORDER — OXYTOCIN-SODIUM CHLORIDE 30-0.9 UT/500ML-% IV SOLN
2.5000 [IU]/h | INTRAVENOUS | Status: DC
  Filled 2021-08-29: qty 500

## 2021-08-29 MED ORDER — LACTATED RINGERS IV SOLN: 500.0000 mL | Freq: Once | INTRAVENOUS | Status: DC

## 2021-08-29 MED ORDER — EPHEDRINE 5 MG/ML INJ: 10.0000 mg | INTRAVENOUS | Status: DC | PRN

## 2021-08-29 MED ORDER — TERBUTALINE SULFATE 1 MG/ML IJ SOLN: 0.2500 mg | Freq: Once | INTRAMUSCULAR | Status: DC | PRN

## 2021-08-29 MED ORDER — ONDANSETRON HCL 4 MG/2ML IJ SOLN
4.0000 mg | Freq: Four times a day (QID) | INTRAMUSCULAR | Status: DC | PRN
  Administered 2021-08-29: 4 mg via INTRAVENOUS
  Filled 2021-08-29: qty 2

## 2021-08-29 MED ORDER — OXYCODONE-ACETAMINOPHEN 5-325 MG PO TABS: 1.0000 | ORAL_TABLET | ORAL | Status: DC | PRN

## 2021-08-29 MED ORDER — SOD CITRATE-CITRIC ACID 500-334 MG/5ML PO SOLN: 30.0000 mL | ORAL | Status: DC | PRN

## 2021-08-29 MED ORDER — PHENYLEPHRINE 40 MCG/ML (10ML) SYRINGE FOR IV PUSH (FOR BLOOD PRESSURE SUPPORT)
80.0000 ug | PREFILLED_SYRINGE | INTRAVENOUS | Status: DC | PRN
Start: 2021-08-29 — End: 2021-08-29

## 2021-08-29 MED ORDER — LIDOCAINE-EPINEPHRINE (PF) 2 %-1:200000 IJ SOLN
INTRAMUSCULAR | Status: DC | PRN
  Administered 2021-08-29: 5 mL via EPIDURAL

## 2021-08-29 MED ORDER — LACTATED RINGERS IV SOLN
500.0000 mL | INTRAVENOUS | Status: DC | PRN
Start: 2021-08-29 — End: 2021-08-31
  Administered 2021-08-29: 500 mL via INTRAVENOUS

## 2021-08-29 MED ORDER — MISOPROSTOL 50MCG HALF TABLET
50.0000 ug | ORAL_TABLET | ORAL | Status: DC | PRN
  Administered 2021-08-29 (×2): 50 ug via BUCCAL
  Filled 2021-08-29 (×3): qty 1

## 2021-08-29 MED ORDER — ACETAMINOPHEN 325 MG PO TABS: 650.0000 mg | ORAL_TABLET | ORAL | Status: DC | PRN

## 2021-08-29 MED ORDER — LIDOCAINE HCL (PF) 1 % IJ SOLN
30.0000 mL | INTRAMUSCULAR | Status: AC | PRN
  Administered 2021-08-30: 30 mL via SUBCUTANEOUS
  Filled 2021-08-29: qty 30

## 2021-08-29 MED ORDER — PHENYLEPHRINE 40 MCG/ML (10ML) SYRINGE FOR IV PUSH (FOR BLOOD PRESSURE SUPPORT): 80.0000 ug | PREFILLED_SYRINGE | INTRAVENOUS | Status: DC | PRN

## 2021-08-29 MED ORDER — PHENYLEPHRINE 40 MCG/ML (10ML) SYRINGE FOR IV PUSH (FOR BLOOD PRESSURE SUPPORT)
80.0000 ug | PREFILLED_SYRINGE | INTRAVENOUS | Status: DC | PRN
  Filled 2021-08-29: qty 10

## 2021-08-29 MED ORDER — DIPHENHYDRAMINE HCL 50 MG/ML IJ SOLN: 12.5000 mg | INTRAMUSCULAR | Status: DC | PRN

## 2021-08-29 MED ORDER — LACTATED RINGERS IV SOLN
500.0000 mL | Freq: Once | INTRAVENOUS | Status: AC
  Administered 2021-08-29: 500 mL via INTRAVENOUS

## 2021-08-29 MED ORDER — FENTANYL-BUPIVACAINE-NACL 0.5-0.125-0.9 MG/250ML-% EP SOLN
12.0000 mL/h | EPIDURAL | Status: DC | PRN
Start: 2021-08-29 — End: 2021-08-29

## 2021-08-29 MED ORDER — PENICILLIN G POT IN DEXTROSE 60000 UNIT/ML IV SOLN
3.0000 10*6.[IU] | INTRAVENOUS | Status: DC
  Administered 2021-08-29 – 2021-08-30 (×8): 3 10*6.[IU] via INTRAVENOUS
  Filled 2021-08-29 (×8): qty 50

## 2021-08-29 MED ORDER — OXYTOCIN BOLUS FROM INFUSION
333.0000 mL | Freq: Once | INTRAVENOUS | Status: AC
  Administered 2021-08-30: 333 mL via INTRAVENOUS

## 2021-08-29 MED ORDER — FENTANYL CITRATE (PF) 100 MCG/2ML IJ SOLN
100.0000 ug | INTRAMUSCULAR | Status: DC | PRN
  Administered 2021-08-29: 100 ug via INTRAVENOUS
  Filled 2021-08-29: qty 2

## 2021-08-29 MED ORDER — OXYTOCIN-SODIUM CHLORIDE 30-0.9 UT/500ML-% IV SOLN
1.0000 m[IU]/min | INTRAVENOUS | Status: DC
  Administered 2021-08-29: 2 m[IU]/min via INTRAVENOUS
  Administered 2021-08-30: 4 m[IU]/min via INTRAVENOUS
  Filled 2021-08-29 (×2): qty 500

## 2021-08-29 MED ORDER — OXYCODONE-ACETAMINOPHEN 5-325 MG PO TABS: 2.0000 | ORAL_TABLET | ORAL | Status: DC | PRN

## 2021-08-29 MED ORDER — PHENYLEPHRINE 40 MCG/ML (10ML) SYRINGE FOR IV PUSH (FOR BLOOD PRESSURE SUPPORT)
200.0000 ug | PREFILLED_SYRINGE | Freq: Once | INTRAVENOUS | Status: AC
  Administered 2021-08-29: 200 ug via INTRAVENOUS

## 2021-08-29 MED ORDER — LEVONORGESTREL 20.1 MCG/DAY IU IUD: 1.0000 | INTRAUTERINE_SYSTEM | Freq: Once | INTRAUTERINE | Status: DC

## 2021-08-29 NOTE — MAU Note (Signed)
Bedside ultrasound per [name]-vertex confirmed

## 2021-08-29 NOTE — Progress Notes (Signed)
Erica Williams is a 25 y.o. G2P0010 at [redacted]w[redacted]d by LMP admitted for rupture of membranes  Subjective: Pt feeling contractions, coping well.  Desires epidural.  Family in room for support.  Objective: BP (!) 144/81   Pulse 94   Temp 97.7 F (36.5 C) (Oral)   Resp 18   Ht 5\' 5"  (1.651 m)   Wt 112.5 kg   LMP 12/01/2020 (Exact Date)   SpO2 99%   BMI 41.27 kg/m  No intake/output data recorded. No intake/output data recorded.  FHT:  FHR: 145 bpm, variability: moderate,  accelerations:  Present,  decelerations:  Absent UC:   irregular, every 2-6 minutes SVE:   Dilation: 5 Effacement (%): 80 Station: -2 Exam by:: [email or web address], [name] Foley bulb was found to be through cervix and in vaginal vault.  Fluid removed by RN using syringe and foley bulb removed without difficulty by [name]  Labs: Lab Results  Component Value Date   WBC 10.3 08/29/2021   HGB 11.2 (L) 08/29/2021   HCT 33.6 (L) 08/29/2021   MCV 85.5 08/29/2021   PLT 230 08/29/2021    Assessment / Plan: Augmentation of labor, progressing well  Labor: Progressing normally Preeclampsia:  labs stable.  BP borderline, some wnl, some 140s/90s.  P/C ratio 0.54 but pt with ROM. GHTN vs elevated BP due to pain. Other PEC labs wnl. Will continue to watch.  Fetal Wellbeing:  Category I Pain Control:  IV pain meds I/D:   GBS positive on PCN Anticipated MOD:  NSVD  08/31/2021 08/29/2021, 2:45 PM

## 2021-08-29 NOTE — Anesthesia Procedure Notes (Signed)
Epidural Patient location during procedure: OB Start time: 08/29/2021 3:40 PM End time: 08/29/2021 3:50 PM  Staffing Anesthesiologist: Elmer Picker, MD Performed: anesthesiologist   Preanesthetic Checklist Completed: patient identified, IV checked, risks and benefits discussed, monitors and equipment checked, pre-op evaluation and timeout performed  Epidural Patient position: sitting Prep: DuraPrep and site prepped and draped Patient monitoring: continuous pulse ox, blood pressure, heart rate and cardiac monitor Approach: midline Location: L3-L4 Injection technique: LOR air  Needle:  Needle type: Tuohy  Needle gauge: 17 G Needle length: 9 cm Needle insertion depth: 7 cm Catheter type: closed end flexible Catheter size: 19 Gauge Catheter at skin depth: 13 cm Test dose: negative  Assessment Sensory level: T8 Events: blood not aspirated, injection not painful, no injection resistance, no paresthesia and negative IV test  Additional Notes Patient identified. Risks/Benefits/Options discussed with patient including but not limited to bleeding, infection, nerve damage, paralysis, failed block, incomplete pain control, headache, blood pressure changes, nausea, vomiting, reactions to medication both or allergic, itching and postpartum back pain. Confirmed with bedside nurse the patient's most recent platelet count. Confirmed with patient that they are not currently taking any anticoagulation, have any bleeding history or any family history of bleeding disorders. Patient expressed understanding and wished to proceed. All questions were answered. Sterile technique was used throughout the entire procedure. Please see nursing notes for vital signs. Test dose was given through epidural catheter and negative prior to continuing to dose epidural or start infusion. Warning signs of high block given to the patient including shortness of breath, tingling/numbness in hands, complete motor block,  or any concerning symptoms with instructions to call for help. Patient was given instructions on fall risk and not to get out of bed. All questions and concerns addressed with instructions to call with any issues or inadequate analgesia.  Reason for block:procedure for pain

## 2021-08-29 NOTE — Progress Notes (Addendum)
Labor Progress Note Erica Williams is a 25 y.o. G2P0010 at [redacted]w[redacted]d presented for IOL for PROM   S: Pt continues to feel comfortable. Nausea has improved  O:  BP 129/87   Pulse 96   Temp 97.9 F (36.6 C) (Oral)   Resp 15   Ht 5\' 5"  (1.651 m)   Wt 112.5 kg   LMP 12/01/2020 (Exact Date)   SpO2 99%   BMI 41.27 kg/m  FHT:  FHR: 140bpm, variability: moderate accelerations:  Present,  decelerations: negative . UC:  regular, every 1-2 minutes  CVE: Dilation: 6 Effacement (%): 90 Station: -1, 0 Presentation: Vertex Exam by:: Dr. [email or web address]   A&P: 25 y.o. G2P0010 [redacted]w[redacted]d presented for IOL for PROM   #Labor: Progressing with increased effacement since last check, continue to titrate up pitocin as tolerated. Currently contracting every 1-2 min however MVU <200. IUPC in place. AROMed a newly noted forebag at current check  #Pain: has epidural  #FWB: Cat 1 #ID: GBS positive on PCN, f/u 3rd TM GC swab  #Contraception: Consented for PP Liletta   #Circumcision: desires  #elevated BP reading: in MAU. Labs sent. LFTS and platelets WNL. P:c done but after rupture and not straight cath sample so not reliable. Patient's BP normotensive so will keep monitoring  [redacted]w[redacted]d, Medical Student 11:42 PM  I examined this patient and made edits as appropriate to the student's note.  Lona Millard, MD, MPH OB Fellow, Faculty Practice

## 2021-08-29 NOTE — Anesthesia Preprocedure Evaluation (Signed)
Anesthesia Evaluation  Patient identified by MRN, date of birth, ID band Patient awake    Reviewed: Allergy & Precautions, NPO status , Patient's Chart, lab work & pertinent test results  Airway Mallampati: III  TM Distance: >3 FB Neck ROM: Full    Dental no notable dental hx.    Pulmonary neg pulmonary ROS,    Pulmonary exam normal breath sounds clear to auscultation       Cardiovascular negative cardio ROS Normal cardiovascular exam Rhythm:Regular Rate:Normal     Neuro/Psych negative neurological ROS  negative psych ROS   GI/Hepatic negative GI ROS, Neg liver ROS,   Endo/Other  Morbid obesity (BMI 41)  Renal/GU negative Renal ROS  negative genitourinary   Musculoskeletal negative musculoskeletal ROS (+)   Abdominal (+) + obese,   Peds  Hematology negative hematology ROS (+)   Anesthesia Other Findings Presents with PROM  Reproductive/Obstetrics (+) Pregnancy                             Anesthesia Physical Anesthesia Plan  ASA: 3  Anesthesia Plan: Epidural   Post-op Pain Management:    Induction:   PONV Risk Score and Plan: Treatment may vary due to age or medical condition  Airway Management Planned: Natural Airway  Additional Equipment:   Intra-op Plan:   Post-operative Plan:   Informed Consent: I have reviewed the patients History and Physical, chart, labs and discussed the procedure including the risks, benefits and alternatives for the proposed anesthesia with the patient or authorized representative who has indicated his/her understanding and acceptance.       Plan Discussed with: Anesthesiologist  Anesthesia Plan Comments: (Patient identified. Risks, benefits, options discussed with patient including but not limited to bleeding, infection, nerve damage, paralysis, failed block, incomplete pain control, headache, blood pressure changes, nausea, vomiting,  reactions to medication, itching, and post partum back pain. Confirmed with bedside nurse the patient's most recent platelet count. Confirmed with the patient that they are not taking any anticoagulation, have any bleeding history or any family history of bleeding disorders. Patient expressed understanding and wishes to proceed. All questions were answered. )        Anesthesia Quick Evaluation

## 2021-08-29 NOTE — Progress Notes (Signed)
Erica Williams is a 25 y.o. G2P0010 at [redacted]w[redacted]d by LMP admitted for rupture of membranes  Subjective: Pt feeling mild contractions, s/o in room for support.  Objective: BP (!) 142/86   Pulse 91   Temp 98.4 F (36.9 C) (Oral)   Resp 18   Ht 5\' 5"  (1.651 m)   Wt 112.5 kg   LMP 12/01/2020 (Exact Date)   SpO2 99%   BMI 41.27 kg/m  No intake/output data recorded. No intake/output data recorded.  FHT:  FHR: 145 bpm, variability: moderate,  accelerations:  Present,  decelerations:  Absent UC:   irregular, every 2-6 minutes SVE:   Dilation: 1 Effacement (%): 80 Station: -2 Exam by:: [email or web address], [name] Foley balloon placed without difficulty. Pt tolerated well.  BBOW/forebag noted with contractions.   Labs: Lab Results  Component Value Date   WBC 10.3 08/29/2021   HGB 11.2 (L) 08/29/2021   HCT 33.6 (L) 08/29/2021   MCV 85.5 08/29/2021   PLT 230 08/29/2021    Assessment / Plan: Augmentation of labor, progressing well  Labor:  Foley balloon for augmentation, hold dose of Cytotec given frequency of contractions and difficulty tracing contractions. Pt feeling more discomfort with contractions in last hour.  IV pain medication ordered PRN. Preeclampsia:  labs stable Fetal Wellbeing:  Category I Pain Control:  IV pain meds I/D:   GBS positive in urine, on PCN Anticipated MOD:  NSVD  08/31/2021 08/29/2021, 11:07 AM

## 2021-08-29 NOTE — Progress Notes (Addendum)
Labor Progress Note Erica Williams is a 25 y.o. G2P0010 at [redacted]w[redacted]d by LMP presented for IOL for PROM   S:  Pt does not endorse headaches. Has been feeling comfortable. Endorses nausea that has improved  O:  BP 116/78   Pulse 98   Temp 97.9 F (36.6 C) (Oral)   Resp 15   Ht 5\' 5"  (1.651 m)   Wt 112.5 kg   LMP 12/01/2020 (Exact Date)   SpO2 99%   BMI 41.27 kg/m  FHT:  FHR: 140bpm, variability: moderate accelerations:  Present,  decelerations: negative . UC:  regular, every 2-3 minutes  CVE: Dilation: 6 Effacement (%): 60 Station: -1 Presentation: Vertex Exam by:: Dr. [email or web address]  Results for orders placed or performed during the hospital encounter of 08/28/21 (from the past 24 hour(s))  POCT fern test     Status: Abnormal   Collection Time: 08/28/21 11:39 PM  Result Value Ref Range   POCT Fern Test Positive = ruptured amniotic membanes   Urinalysis, Routine w reflex microscopic Urine, Clean Catch     Status: Abnormal   Collection Time: 08/28/21 11:39 PM  Result Value Ref Range   Color, Urine YELLOW YELLOW   APPearance HAZY (A) CLEAR   Specific Gravity, Urine 1.006 1.005 - 1.030   pH 6.0 5.0 - 8.0   Glucose, UA NEGATIVE NEGATIVE mg/dL   Hgb urine dipstick NEGATIVE NEGATIVE   Bilirubin Urine NEGATIVE NEGATIVE   Ketones, ur NEGATIVE NEGATIVE mg/dL   Protein, ur 30 (A) NEGATIVE mg/dL   Nitrite NEGATIVE NEGATIVE   Leukocytes,Ua TRACE (A) NEGATIVE   RBC / HPF 0-5 0 - 5 RBC/hpf   WBC, UA 6-10 0 - 5 WBC/hpf   Bacteria, UA RARE (A) NONE SEEN   Squamous Epithelial / LPF 6-10 0 - 5  Resp Panel by RT-PCR (Flu A&B, Covid) Nasopharyngeal Swab     Status: None   Collection Time: 08/29/21 12:54 AM   Specimen: Nasopharyngeal Swab; Nasopharyngeal(NP) swabs in vial transport medium  Result Value Ref Range   SARS Coronavirus 2 by RT PCR NEGATIVE NEGATIVE   Influenza A by PCR NEGATIVE NEGATIVE   Influenza B by PCR NEGATIVE NEGATIVE  CBC     Status: Abnormal   Collection Time: 08/29/21 12:54  AM  Result Value Ref Range   WBC 10.3 4.0 - 10.5 K/uL   RBC 3.93 3.87 - 5.11 MIL/uL   Hemoglobin 11.2 (L) 12.0 - 15.0 g/dL   HCT 08/31/21 (L) 37.8 - 58.8 %   MCV 85.5 80.0 - 100.0 fL   MCH 28.5 26.0 - 34.0 pg   MCHC 33.3 30.0 - 36.0 g/dL   RDW 50.2 77.4 - 12.8 %   Platelets 230 150 - 400 K/uL   nRBC 0.0 0.0 - 0.2 %  Type and screen  [facility]     Status: None   Collection Time: 08/29/21 12:54 AM  Result Value Ref Range   ABO/RH(D) A POS    Antibody Screen NEG    Sample Expiration      09/01/2021,2359 Performed at [facility], [address], [place], [place]   RPR     Status: None   Collection Time: 08/29/21 12:54 AM  Result Value Ref Range   RPR Ser Ql NON REACTIVE NON REACTIVE  Comprehensive metabolic panel     Status: Abnormal   Collection Time: 08/29/21 12:54 AM  Result Value Ref Range   Sodium 135 135 - 145 mmol/L  Potassium 3.7 3.5 - 5.1 mmol/L   Chloride 106 98 - 111 mmol/L   CO2 19 (L) 22 - 32 mmol/L   Glucose, Bld 84 70 - 99 mg/dL   BUN 5 (L) 6 - 20 mg/dL   Creatinine, Ser 8.65 0.44 - 1.00 mg/dL   Calcium 9.3 8.9 - 78.4 mg/dL   Total Protein 6.9 6.5 - 8.1 g/dL   Albumin 3.0 (L) 3.5 - 5.0 g/dL   AST 20 15 - 41 U/L   ALT 13 0 - 44 U/L   Alkaline Phosphatase 111 38 - 126 U/L   Total Bilirubin 0.3 0.3 - 1.2 mg/dL   GFR, Estimated >69 >62 mL/min   Anion gap 10 5 - 15  Protein / creatinine ratio, urine     Status: Abnormal   Collection Time: 08/29/21  8:29 AM  Result Value Ref Range   Creatinine, Urine 80.32 mg/dL   Total Protein, Urine 43 mg/dL   Protein Creatinine Ratio 0.54 (H) 0.00 - 0.15 mg/mg[Cre]    A&P: 25 y.o. G2P0010 [redacted]w[redacted]d presented for IOL for PROM    #Labor: Progressing well, will continue uptitrating pitocin. IUPC in place. Had AROM of forebag at last check.  #Pain: has epidural  #FWB: Cat 1 #ID: GBS positive on PCN, f/u 3rd TM GC swab  #Contraception: Consented for PP Liletta   #Circumcision: desires   #elevated BP reading: in MAU. Labs sent. LFTS and platelets WNL. P:c done but after rupture and not straight cath sample so not reliable. Patient's BP normotensive so will keep monitoring   Lona Millard, Medical Student 8:49 PM   I saw and evaluated the patient. I agree with the findings and the plan of care as documented in the student's note and have made necessary edits.  [name], MD, MPH OB Fellow, Faculty Practice

## 2021-08-29 NOTE — Progress Notes (Signed)
Erica Williams is a 25 y.o. G2P0010 at [redacted]w[redacted]d by LMP admitted for rupture of membranes  Subjective: Pt comfortable with epidural. Family in room for support.  Objective: BP 122/82   Pulse 99   Temp 97.7 F (36.5 C) (Oral)   Resp 18   Ht 5\' 5"  (1.651 m)   Wt 112.5 kg   LMP 12/01/2020 (Exact Date)   SpO2 99%   BMI 41.27 kg/m  No intake/output data recorded. No intake/output data recorded.  FHT:  FHR: 150 bpm, variability: moderate with periods of minimal,  accelerations:  Present,  decelerations:  Present variable x 1 and lates x 2-3 following epidural, now resolved. Accel with pt drinking cold fluids and positive scalp stimulation on [name] exam. No additional decels. UC:   regular, every 2-3 minutes SVE:   Dilation: 5 Effacement (%): 80 Station: -2 Exam by:: [email or web address], [name] AROM of forebag, clear fluid.  IUPC placed without difficulty. Pt tolerated well.  Labs: Lab Results  Component Value Date   WBC 10.3 08/29/2021   HGB 11.2 (L) 08/29/2021   HCT 33.6 (L) 08/29/2021   MCV 85.5 08/29/2021   PLT 230 08/29/2021    Assessment / Plan: Augmentation of labor, progressing well  Labor:  Pt 5 cm following foley balloon. Discussed options with pt including continuing to titrate Pitocin alone or AROM  of forebag. Pt desires AROM of forebag. Discussed with pt and placed IUPC for improved contraction/Pitocin management. Preeclampsia:  labs stable Fetal Wellbeing:  Category II and decels resolved, return to Category I with accels Pain Control:  Epidural I/D:   GBS positive on PCN Anticipated MOD:  NSVD  08/31/2021 08/29/2021, 5:22 PM

## 2021-08-29 NOTE — Progress Notes (Signed)
Erica Williams is a 25 y.o. G2P0010 at [redacted]w[redacted]d  admitted for induction of labor due to PROM.  Subjective: Patient reports not feeling contractions.   Discussed and verbally consented for PP IUD. Discussed that if GC/CT lab comes back positive we would hold off on doing the IUD until treated  Objective: BP (!) 95/51 Comment: pt asymptomatic; on side  Pulse 86   Temp 97.9 F (36.6 C) (Oral)   Resp 15   Ht 5\' 5"  (1.651 m)   Wt 112.5 kg   LMP 12/01/2020 (Exact Date)   SpO2 99%   BMI 41.27 kg/m  No intake/output data recorded. No intake/output data recorded.  FHT:  FHR: 140 bpm, variability: moderate,  accelerations:  Present,  decelerations:  Absent UC:   irregular SVE:   Dilation: 1 Effacement (%): 50 Station: -3 Exam by:: Dr. [email or web address]  Labs: Lab Results  Component Value Date   WBC 10.3 08/29/2021   HGB 11.2 (L) 08/29/2021   HCT 33.6 (L) 08/29/2021   MCV 85.5 08/29/2021   PLT 230 08/29/2021    Assessment / Plan: Induction of labor due to PROM  Labor:  cervix more effaced since 1st cytotec, will do another cytotec now. If continues to be below 3 cm on next check consider balloon.  Preeclampsia:  MAU had BP in 130s/90s. Will add on CMET Fetal Wellbeing:  Category I Pain Control:   planning unmedicated I/D:   GBS pos>PCN Anticipated MOD:  NSVD  08/31/2021 08/29/2021, 7:58 AM

## 2021-08-29 NOTE — Progress Notes (Signed)
Erica Williams is a 25 y.o. G2P0010 at [redacted]w[redacted]d by LMP admitted for rupture of membranes  Subjective: Pt feeling mild cramping, s/o in room for support.  Objective: BP 129/86   Pulse 95   Temp 98.4 F (36.9 C) (Oral)   Resp 16   Ht 5\' 5"  (1.651 m)   Wt 112.5 kg   LMP 12/01/2020 (Exact Date)   SpO2 99%   BMI 41.27 kg/m  No intake/output data recorded. No intake/output data recorded.  FHT:  FHR: 145 bpm, variability: moderate,  accelerations:  Present,  decelerations:  Absent UC:   irregular, every 2-10 minutes SVE:   Dilation: 1 Effacement (%): 50 Station: -3 Exam by:: Dr. [email or web address]  Labs: Lab Results  Component Value Date   WBC 10.3 08/29/2021   HGB 11.2 (L) 08/29/2021   HCT 33.6 (L) 08/29/2021   MCV 85.5 08/29/2021   PLT 230 08/29/2021    Assessment / Plan: Augmentation of labor, progressing well  Labor:  Discussed options with pt, including expectant management, continuing Cytotec alone, or Cytotec plus foley balloon. Pt desires foley balloon with next Cytotec dose.   Preeclampsia:   isolated elevated BP on admission, normal CBC, CMP, P/C ratio pending Fetal Wellbeing:  Category I Pain Control:  Labor support without medications I/D:   GBS positive on PCN Anticipated MOD:  NSVD  08/31/2021 08/29/2021, 10:10 AM

## 2021-08-29 NOTE — H&P (Signed)
OBSTETRIC ADMISSION HISTORY AND PHYSICAL  Erica Williams is a 25 y.o. female G2P0010 with IUP at [redacted]w[redacted]d by LMP presenting for PROM. She reports +FMs, No LOF, no VB, no blurry vision, headaches or peripheral edema, and RUQ pain.  She plans on breast and bottle feeding. She request Post placental  for birth control. She received her prenatal care at [facility]   Dating: By LMP --->  Estimated Date of Delivery: 09/07/21  Sono:   @[redacted]w[redacted]d , CWD, normal anatomy, Cephalic presentation, posterior placental lie, 3648g, 83% EFW   Prenatal History/Complications:  GBS positive in urine   Past Medical History: Past Medical History:  Diagnosis Date   Medical history non-contributory     Past Surgical History: Past Surgical History:  Procedure Laterality Date   left knee surgery Left     Obstetrical History: OB History     Gravida  2   Para      Term      Preterm      AB  1   Living  0      SAB      IAB  1   Ectopic      Multiple      Live Births              Social History Social History   Socioeconomic History   Marital status: Single    Spouse name: Not on file   Number of children: Not on file   Years of education: Not on file   Highest education level: Not on file  Occupational History   Not on file  Tobacco Use   Smoking status: Never    Passive exposure: Current   Smokeless tobacco: Never  Vaping Use   Vaping Use: Former   Substances: Flavoring  Substance and Sexual Activity   Alcohol use: Not Currently   Drug use: Never   Sexual activity: Yes    Birth control/protection: None  Other Topics Concern   Not on file  Social History Narrative   Not on file   Social Determinants of Health   Financial Resource Strain: Low Risk    Difficulty of Paying Living Expenses: Not hard at all  Food Insecurity: No Food Insecurity   Worried About in the Last Year: Never true   Ran Out of Food in the Last Year: Never true   Transportation Needs: No Transportation Needs   Lack of Transportation (Medical): No   Lack of Transportation (Non-Medical): No  Physical Activity: Sufficiently Active   Days of Exercise per Week: 6 days   Minutes of Exercise per Session: 100 min  Stress: No Stress Concern Present   Feeling of Stress : Not at all  Social Connections: Moderately Isolated   Frequency of Communication with Friends and Family: More than three times a week   Frequency of Social Gatherings with Friends and Family: Once a week   Attends Religious Services: Never   Programme researcher, broadcasting/film/video or Organizations: No   Attends Database administrator: Never   Marital Status: Living with partner    Family History: Family History  Problem Relation Age of Onset   Hypertension Father    Hypertension Mother    Breast cancer Maternal Aunt     Allergies: No Known Allergies  Medications Prior to Admission  Medication Sig Dispense Refill Last Dose   aspirin 81 MG EC tablet Take 2 tablets (162 mg total) by mouth daily. Swallow whole. 180 tablet  2 08/28/2021   ferrous sulfate 325 (65 FE) MG tablet Take 1 tablet (325 mg total) by mouth every other day. 45 tablet 2 08/28/2021   Prenatal Vit-Fe Fumarate-FA (MULTIVITAMIN-PRENATAL) 27-0.8 MG TABS tablet Take 1 tablet by mouth daily at 12 noon.   08/28/2021     Review of Systems   All systems reviewed and negative except as stated in HPI  Blood pressure 128/77, pulse 87, temperature 98.7 F (37.1 C), temperature source Oral, resp. rate 15, height 5\' 5"  (1.651 m), weight 112.5 kg, last menstrual period 12/01/2020, SpO2 99 %. General appearance: alert Lungs: clear to auscultation bilaterally Heart: regular rate and rhythm Abdomen: soft, non-tender; bowel sounds normal Extremities: Homans sign is negative, no sign of DVT Presentation: cephalic Fetal monitoringBaseline: 140 bpm, Variability: Good {> 6 bpm), Accelerations: Reactive, and Decelerations: Absent Uterine  activity irregular Dilation: 1 Effacement (%): 30 Station: -3 Exam by:: Dr. [email or web address]   Prenatal labs: ABO, Rh: --/--/A POS (09/18 [birth year]1978) Antibody: NEG (09/18 0054) Rubella: 8.82 (03/16 1507) RPR: Non Reactive (07/18 0920)  HBsAg: Negative (03/16 1507)  HIV: Non Reactive (07/18 0920)  GBS:   Positive (Bacteruria) 2 hr Glucola 80/166/141 (normal) Genetic screening  AFP negative, NIPS LR Anatomy 02[birth year]1990 normal  Prenatal Transfer Tool  Maternal Diabetes: No Genetic Screening: Normal Maternal Ultrasounds/Referrals: Normal Fetal Ultrasounds or other Referrals:  None Maternal Substance Abuse:  No Significant Maternal Medications:  None Significant Maternal Lab Results: Group B Strep positive  Results for orders placed or performed during the hospital encounter of 08/28/21 (from the past 24 hour(s))  Urinalysis, Routine w reflex microscopic Urine, Clean Catch   Collection Time: 08/28/21 11:39 PM  Result Value Ref Range   Color, Urine YELLOW YELLOW   APPearance HAZY (A) CLEAR   Specific Gravity, Urine 1.006 1.005 - 1.030   pH 6.0 5.0 - 8.0   Glucose, UA NEGATIVE NEGATIVE mg/dL   Hgb urine dipstick NEGATIVE NEGATIVE   Bilirubin Urine NEGATIVE NEGATIVE   Ketones, ur NEGATIVE NEGATIVE mg/dL   Protein, ur 30 (A) NEGATIVE mg/dL   Nitrite NEGATIVE NEGATIVE   Leukocytes,Ua TRACE (A) NEGATIVE   RBC / HPF 0-5 0 - 5 RBC/hpf   WBC, UA 6-10 0 - 5 WBC/hpf   Bacteria, UA RARE (A) NONE SEEN   Squamous Epithelial / LPF 6-10 0 - 5  POCT fern test   Collection Time: 08/28/21 11:39 PM  Result Value Ref Range   POCT Fern Test Positive = ruptured amniotic membanes   CBC   Collection Time: 08/29/21 12:54 AM  Result Value Ref Range   WBC 10.3 4.0 - 10.5 K/uL   RBC 3.93 3.87 - 5.11 MIL/uL   Hemoglobin 11.2 (L) 12.0 - 15.0 g/dL   HCT 08/31/21 (L) 01.7 - 51.0 %   MCV 85.5 80.0 - 100.0 fL   MCH 28.5 26.0 - 34.0 pg   MCHC 33.3 30.0 - 36.0 g/dL   RDW 25.8 52.7 - 78.2 %   Platelets 230 150 - 400 K/uL   nRBC  0.0 0.0 - 0.2 %  Type and screen [facility]   Collection Time: 08/29/21 12:54 AM  Result Value Ref Range   ABO/RH(D) A POS    Antibody Screen NEG    Sample Expiration      09/01/2021,2359 Performed at [facility], [address] [place], [place]     Patient Active Problem List   Diagnosis Date Noted   Supervision of other normal  pregnancy, antepartum 08/29/2021   Uterine size date discrepancy 08/11/2021   Abnormal Pap smear of cervix 03/08/2021   GBS bacteriuria 02/26/2021   Marijuana use 02/25/2021   Supervision of low-risk pregnancy 02/24/2021   Elevated BP without diagnosis of hypertension 08/24/2020    Assessment/Plan:  Lummie Montijo is a 25 y.o. G2P0010 at [redacted]w[redacted]d here for PROM  #Labor: 1 cm dilated, not contracting regularly. Will start with Cytotec buccal. Will assess for balloon at next check given ruptured.  #Pain: Plans un medicated. Open to medications #FWB: Cat I #ID:  GBS positive, PCN  #MOF: both #MOC: Post placental liletta #Circ:  Yes   [name], MD  08/29/2021, 2:24 AM

## 2021-08-30 ENCOUNTER — Encounter (HOSPITAL_COMMUNITY): Payer: Self-pay | Admitting: Obstetrics & Gynecology

## 2021-08-30 DIAGNOSIS — O99824 Streptococcus B carrier state complicating childbirth: Secondary | ICD-10-CM

## 2021-08-30 DIAGNOSIS — O4292 Full-term premature rupture of membranes, unspecified as to length of time between rupture and onset of labor: Secondary | ICD-10-CM

## 2021-08-30 DIAGNOSIS — Z3A38 38 weeks gestation of pregnancy: Secondary | ICD-10-CM

## 2021-08-30 LAB — GC/CHLAMYDIA PROBE AMP (~~LOC~~) NOT AT ARMC
Chlamydia: POSITIVE — AB
Comment: NEGATIVE
Comment: NORMAL
Neisseria Gonorrhea: NEGATIVE

## 2021-08-30 LAB — PROTEIN / CREATININE RATIO, URINE
Creatinine, Urine: 55.69 mg/dL
Protein Creatinine Ratio: 0.16 mg/mg{Cre} — ABNORMAL HIGH (ref 0.00–0.15)
Total Protein, Urine: 9 mg/dL

## 2021-08-30 MED ORDER — METHYLERGONOVINE MALEATE 0.2 MG/ML IJ SOLN
INTRAMUSCULAR | Status: AC
  Administered 2021-08-30: 0.2 mg
  Filled 2021-08-30: qty 1

## 2021-08-30 MED ORDER — MISOPROSTOL 200 MCG PO TABS
1000.0000 ug | ORAL_TABLET | Freq: Once | ORAL | Status: AC
  Administered 2021-08-30: 1000 ug via RECTAL

## 2021-08-30 MED ORDER — TERBUTALINE SULFATE 1 MG/ML IJ SOLN: 0.2500 mg | Freq: Once | INTRAMUSCULAR | Status: DC | PRN

## 2021-08-30 MED ORDER — OXYTOCIN-SODIUM CHLORIDE 30-0.9 UT/500ML-% IV SOLN: 1.0000 m[IU]/min | INTRAVENOUS | Status: DC

## 2021-08-30 MED ORDER — AZITHROMYCIN 500 MG PO TABS
1000.0000 mg | ORAL_TABLET | Freq: Once | ORAL | Status: AC
  Administered 2021-08-30: 1000 mg via ORAL
  Filled 2021-08-30: qty 2

## 2021-08-30 MED ORDER — TRANEXAMIC ACID-NACL 1000-0.7 MG/100ML-% IV SOLN
INTRAVENOUS | Status: AC
  Administered 2021-08-30: 1000 mg
  Filled 2021-08-30: qty 100

## 2021-08-30 MED ORDER — MISOPROSTOL 200 MCG PO TABS
ORAL_TABLET | ORAL | Status: AC
  Filled 2021-08-30: qty 5

## 2021-08-30 NOTE — Progress Notes (Signed)
Labor Progress Note Erica Williams is a 25 y.o. G2P0010 at [redacted]w[redacted]d presented for IOL due to PROM.   S: Doing well. Has been in several different positions with the peanut.   O:  BP 111/62   Pulse 95   Temp 98.9 F (37.2 C) (Oral)   Resp 16   Ht 5\' 5"  (1.651 m)   Wt 112.5 kg   LMP 12/01/2020 (Exact Date)   SpO2 99%   BMI 41.27 kg/m  EFM: 130/mod/15x15/none   CVE: Dilation: 9 Effacement (%): 100 Station: -1 Presentation: Vertex Exam by:: Dr. [email or web address]   A&P: 25 y.o. G2P0010 [redacted]w[redacted]d  #Labor: Fortunately, actually progressed to about 9cm with some descent in the vault. Will continue management as is with pit and position changes as tolerated. Recheck in about 2 hours.  #Pain: Epidural  #FWB: Cat 1 #GBS positive, PCN   #gHTN: SBP 110's. Asymptomatic.   #Prolonged ROM: No s/sx of infection.  [redacted]w[redacted]d, DO 3:48 PM

## 2021-08-30 NOTE — Progress Notes (Signed)
Introduced myself to patient at bedside. Her IUPC was recently re-zeroed and flushed without change in contraction strength. IUPC placement checked by RN around 0900, during this time noted cervical exam unchanged as well.   She has been ruptured >24 hours (however has had forebag rupture x2 as well) and on pit 2x2 (now at 24) since 1500 yesterday afternoon. No cervical dilation change since 2015 yesterday evening (9/18), however has effaced from 60>100%.  Will continue with pit, not above 30, and position changes with peanut ball to hopefully allow appropriate fetal placement and application to the cervix. Will recheck later this morning, if unchanged will highly consider proceeding with C-section. Patient aware of this and willing to proceed. FHT remaining Cat 1, no s/sx of infection with prolonged ROM.   Allayne Stack, DO

## 2021-08-30 NOTE — Lactation Note (Signed)
This note was copied from a baby's chart. Lactation Consultation Note  Patient Name: Erica Williams [id number] Date: 08/30/2021 Reason for consult: L&D Initial assessment;1st time breastfeeding;Early term 37-38.6wks Age:26 hours Mom initally wanted to formula feed only but decided in L&D she wants to breast and formula feed infant.  Mom made multiple attempts to latch infant on her right breast using the football hold position but infant only held nipple in mouth and would not elicit the SSB reponse. Mom knows to breastfeed infant according to cues, 8 to 12+ times within 24 hours, skin to skin. Mom knows RN/LC on MBU will further assist with helping to latch infant at the breast. Mom was doing skin to skin when LC left the room. Maternal Data Has patient been taught Hand Expression?: Yes Does the patient have breastfeeding experience prior to this delivery?: No  Feeding Mother's Current Feeding Choice: Breast Milk and Formula  LATCH Score Latch: Too sleepy or reluctant, no latch achieved, no sucking elicited.  Audible Swallowing: None  Type of Nipple: Everted at rest and after stimulation  Comfort (Breast/Nipple): Soft / non-tender  Hold (Positioning): Assistance needed to correctly position infant at breast and maintain latch.  LATCH Score: 5   Lactation Tools Discussed/Used    Interventions Interventions: Skin to skin;Hand express;Education;Position options;Support pillows;Adjust position;Breast compression;Assisted with latch  Discharge WIC Program: No  Consult Status Consult Status: Follow-up Date: 08/31/21 Follow-up type: In-patient    Danelle Earthly 08/30/2021, 11:48 PM

## 2021-08-30 NOTE — Progress Notes (Addendum)
Labor Progress Note Erica Williams is a 25 y.o. G2P0010 at [redacted]w[redacted]d presented for IOL for PROM   S: Pt continues to do well. Reports feeling more constant pressure.   O:  BP 130/82   Pulse (!) 101   Temp 98.5 F (36.9 C) (Oral)   Resp 16   Ht 5\' 5"  (1.651 m)   Wt 112.5 kg   LMP 12/01/2020 (Exact Date)   SpO2 99%   BMI 41.27 kg/m  EFM: 140/moderate/positive accels/no decels    CVE: Dilation: 10 Effacement (%): 100 Station: Plus 1 Presentation: Vertex Exam by:: Dr. [email or web address]  A&P: 25 y.o. G2P0010 [redacted]w[redacted]d for IOL for PROM   #Labor: Pt continues to be progressing well. Cervix now completely dilated. Significant caput on exam. Head at +1 Continue on Pit. Good effort on practice push. Offered patient to labor down or push and patient desires to push instead of having a labor break.  #Pain: Epidural  #FWB: Cat 1 #ID: GBS positive -> PCN #gHTN: Last BP: 130/82. Asymptomatic   #prolonged ROM: Ruptured for 48 hrs. No maternal fever and no fetal tachycardia at this time. Continue to monitor  #Chlamydia positive Treated with Azithromycin today. Will not do post placental IUD and instead get OP IUD  #delivery: anticipate SVD   [redacted]w[redacted]d, Medical Student 8:47 PM   GME ATTESTATION:  I saw and evaluated the patient. I have made necessary edits to the above documentation and agree with the student's note above.  Lona Millard, MD, MPH OB Fellow, [facility], [facility] 08/30/2021 9:04 PM

## 2021-08-30 NOTE — Progress Notes (Signed)
Labor Progress Note Erica Williams is a 25 y.o. G2P0010 at [redacted]w[redacted]d presented for IOL due to PROM.   S: Feeling well. Epidural in place, not really feeling any contractions/pressure.   O:  BP 118/75   Pulse 97   Temp 98.8 F (37.1 C) (Oral)   Resp 16   Ht 5\' 5"  (1.651 m)   Wt 112.5 kg   LMP 12/01/2020 (Exact Date)   SpO2 99%   BMI 41.27 kg/m  EFM: 135/mod/15x15/no decels   CVE: Dilation: 6 Effacement (%): 100 Station: -2 Presentation: Vertex Exam by:: Dr. [email or web address]   A&P: 25 y.o. G2P0010 [redacted]w[redacted]d  #Labor: Minimal to no progression since last check. Pit was discontinued at 10am, 2 hour pit break and will restart now at 4 ml/hr with 2x2. Plan to recheck in about 3-4 hours (sooner if needed), if no change, will proceed with c-section. Cont position changes.  #Pain: Epidural  #FWB: Cat 1  #GBS positive PCN   #Prolonged ROM: No s/sx of infection at this time. Cont to monitor.   #GHTN: several elevated BP since admit >4 hours apart, however BP generally 110-120 systolic. No symptoms. Pre-e labs wnl, P/Cr ratio falsely elevated earlier d/t ROM. Cont to monitor.   #Chlamydia vaginitis: Returned positive via swab collected on 9/18. Dr. 10/18 and I discussed this result privately with the patient. Azithromycin 1g ordered and given. She is aware of need for partner treatment and nature of transmission. Will postpone IUD placement (desired post-placental) until postpartum visit with TOC prior to.   Crissie Reese, DO 12:08 PM

## 2021-08-30 NOTE — Progress Notes (Signed)
Labor Progress Note Erica Williams is a 25 y.o. G2P0010 at [redacted]w[redacted]d presented for IOL for PROM S: Continues to do well. No complaints. Has tried many position changes per RN.   O:  BP 121/77   Pulse 94   Temp 99.4 F (37.4 C) (Oral)   Resp 16   Ht 5\' 5"  (1.651 m)   Wt 112.5 kg   LMP 12/01/2020 (Exact Date)   SpO2 99%   BMI 41.27 kg/m  EFM: 140/moderate/no accels or decels  CVE: Dilation: Lip/rim Effacement (%): 100 Station: 0 Presentation: Vertex Exam by:: Dr. [email or web address]   A&P: 25 y.o. G2P0010 [redacted]w[redacted]d IOL for PROM #Labor: Progressing well. Now just with rim on R side. Baby's head has descended to 0 station. Continue pit and position on R side.  #Pain: Epidural in place #FWB: Cat  I  #GBS positive PCN #gHTN: Last BP 121/77. Asymptomatic  #prolonged ROM: Has been ruptured 46hrs, no signs/symptoms of infection at this time. Will continue to monitor. Anticipate SVD  [redacted]w[redacted]d, MD 6:14 PM

## 2021-08-30 NOTE — Progress Notes (Signed)
Erica Williams is a 25 y.o. G2P0010 at [redacted]w[redacted]d  admitted for PROM (2020 on 9/17)  Subjective: Reports feeling about the same. Asking about when we would decide to stop trying for a vaginal delivery and consider cesarean section  Objective: BP 119/71   Pulse 89   Temp 98.5 F (36.9 C) (Oral)   Resp 15   Ht 5\' 5"  (1.651 m)   Wt 112.5 kg   LMP 12/01/2020 (Exact Date)   SpO2 99%   BMI 41.27 kg/m  I/O last 3 completed shifts: In: -  Out: 1475 [Urine:1475] No intake/output data recorded.  FHT:  FHR: 140 bpm, variability: moderate,  accelerations:  Present,  decelerations:  Absent UC:   regular, every 1 minute, however MVU consistently continue to be less than 200 SVE:   Dilation: 6 Effacement (%): 100 Station: 0 Exam by:: Dr. [email or web address]  Labs: Lab Results  Component Value Date   WBC 10.3 08/29/2021   HGB 11.2 (L) 08/29/2021   HCT 33.6 (L) 08/29/2021   MCV 85.5 08/29/2021   PLT 230 08/29/2021    Assessment / Plan: Induction of labor due to PRO, continues to be 6 cm  Labor: Since last check 4 hours ago has not made change. Patient's contractions are frequent but not adequate. Currently at pitocin of 22. Will continue to increase at this time. But will consider pitocin break with oncoming team. Also discussed with patient that fetus continues to be Cat I which is reassuring. However if there is no change with next check would be concerning for Arrest of dilation. Will further discuss with oncoming team and relay plan to beside RN after Fetal Wellbeing:  Category I Pain Control:  Epidural I/D:   GBS positive>PCN   08/31/2021 08/30/2021, 7:43 AM

## 2021-08-31 ENCOUNTER — Encounter: Payer: Self-pay | Admitting: Women's Health

## 2021-08-31 LAB — CBC
HCT: 24.3 % — ABNORMAL LOW (ref 36.0–46.0)
Hemoglobin: 8.3 g/dL — ABNORMAL LOW (ref 12.0–15.0)
MCH: 28.9 pg (ref 26.0–34.0)
MCHC: 34.2 g/dL (ref 30.0–36.0)
MCV: 84.7 fL (ref 80.0–100.0)
Platelets: 191 K/uL (ref 150–400)
RBC: 2.87 MIL/uL — ABNORMAL LOW (ref 3.87–5.11)
RDW: 12.8 % (ref 11.5–15.5)
WBC: 15.4 K/uL — ABNORMAL HIGH (ref 4.0–10.5)
nRBC: 0 % (ref 0.0–0.2)

## 2021-08-31 MED ORDER — FERROUS SULFATE 325 (65 FE) MG PO TABS: 325.0000 mg | ORAL_TABLET | ORAL | Status: DC

## 2021-08-31 MED ORDER — PRENATAL MULTIVITAMIN CH
1.0000 | ORAL_TABLET | Freq: Every day | ORAL | Status: DC
  Administered 2021-08-31 – 2021-09-01 (×2): 1 via ORAL
  Filled 2021-08-31 (×2): qty 1

## 2021-08-31 MED ORDER — NIFEDIPINE ER OSMOTIC RELEASE 30 MG PO TB24: 30.0000 mg | ORAL_TABLET | Freq: Every day | ORAL | Status: DC

## 2021-08-31 MED ORDER — TETANUS-DIPHTH-ACELL PERTUSSIS 5-2.5-18.5 LF-MCG/0.5 IM SUSY: 0.5000 mL | PREFILLED_SYRINGE | Freq: Once | INTRAMUSCULAR | Status: DC

## 2021-08-31 MED ORDER — BENZOCAINE-MENTHOL 20-0.5 % EX AERO
1.0000 "application " | INHALATION_SPRAY | CUTANEOUS | Status: DC | PRN
  Administered 2021-08-31: 1 via TOPICAL
  Filled 2021-08-31: qty 56

## 2021-08-31 MED ORDER — COVID-19MRNA BIVAL VACC PFIZER 30 MCG/0.3ML IM SUSP
0.3000 mL | Freq: Once | INTRAMUSCULAR | Status: AC
  Administered 2021-08-31: 0.3 mL via INTRAMUSCULAR
  Filled 2021-08-31: qty 0.3

## 2021-08-31 MED ORDER — ONDANSETRON HCL 4 MG PO TABS: 4.0000 mg | ORAL_TABLET | ORAL | Status: DC | PRN

## 2021-08-31 MED ORDER — WITCH HAZEL-GLYCERIN EX PADS: 1.0000 "application " | MEDICATED_PAD | CUTANEOUS | Status: DC | PRN

## 2021-08-31 MED ORDER — METHYLERGONOVINE MALEATE 0.2 MG/ML IJ SOLN: 0.2000 mg | INTRAMUSCULAR | Status: DC | PRN

## 2021-08-31 MED ORDER — ONDANSETRON HCL 4 MG/2ML IJ SOLN: 4.0000 mg | INTRAMUSCULAR | Status: DC | PRN

## 2021-08-31 MED ORDER — SIMETHICONE 80 MG PO CHEW: 80.0000 mg | CHEWABLE_TABLET | ORAL | Status: DC | PRN

## 2021-08-31 MED ORDER — MEASLES, MUMPS & RUBELLA VAC IJ SOLR: 0.5000 mL | Freq: Once | INTRAMUSCULAR | Status: DC

## 2021-08-31 MED ORDER — SENNOSIDES-DOCUSATE SODIUM 8.6-50 MG PO TABS
2.0000 | ORAL_TABLET | Freq: Every day | ORAL | Status: DC
  Administered 2021-09-01: 2 via ORAL
  Filled 2021-08-31: qty 2

## 2021-08-31 MED ORDER — ACETAMINOPHEN 325 MG PO TABS
650.0000 mg | ORAL_TABLET | ORAL | Status: DC | PRN
  Administered 2021-08-31 (×2): 650 mg via ORAL
  Filled 2021-08-31 (×3): qty 2

## 2021-08-31 MED ORDER — COCONUT OIL OIL: 1.0000 "application " | TOPICAL_OIL | Status: DC | PRN

## 2021-08-31 MED ORDER — IBUPROFEN 600 MG PO TABS
600.0000 mg | ORAL_TABLET | Freq: Four times a day (QID) | ORAL | Status: DC
  Administered 2021-08-31 – 2021-09-01 (×7): 600 mg via ORAL
  Filled 2021-08-31 (×7): qty 1

## 2021-08-31 MED ORDER — DIPHENHYDRAMINE HCL 25 MG PO CAPS: 25.0000 mg | ORAL_CAPSULE | Freq: Four times a day (QID) | ORAL | Status: DC | PRN

## 2021-08-31 MED ORDER — MEDROXYPROGESTERONE ACETATE 150 MG/ML IM SUSP: 150.0000 mg | INTRAMUSCULAR | Status: DC | PRN

## 2021-08-31 MED ORDER — DIBUCAINE (PERIANAL) 1 % EX OINT: 1.0000 "application " | TOPICAL_OINTMENT | CUTANEOUS | Status: DC | PRN

## 2021-08-31 MED ORDER — TRANEXAMIC ACID-NACL 1000-0.7 MG/100ML-% IV SOLN: 1000.0000 mg | Freq: Once | INTRAVENOUS | Status: DC

## 2021-08-31 NOTE — Anesthesia Postprocedure Evaluation (Signed)
Anesthesia Post Note  Patient: Erica Williams  Procedure(s) Performed: AN AD HOC LABOR EPIDURAL     Patient location during evaluation: Mother Baby Anesthesia Type: Epidural Level of consciousness: awake and alert Pain management: pain level controlled Vital Signs Assessment: post-procedure vital signs reviewed and stable Respiratory status: spontaneous breathing, nonlabored ventilation and respiratory function stable Cardiovascular status: stable Postop Assessment: no headache, no backache, epidural receding, no apparent nausea or vomiting, patient able to bend at knees, able to ambulate and adequate PO intake Anesthetic complications: no   No notable events documented.  Last Vitals:  Vitals:   08/31/21 0115 08/31/21 0613  BP: 130/77 125/82  Pulse: 99 100  Resp: 20 20  Temp: 36.9 C 36.6 C  SpO2: 100% 97%    Last Pain:  Vitals:   08/31/21 0613  TempSrc: Oral  PainSc:    Pain Goal:                   [name]

## 2021-08-31 NOTE — Discharge Summary (Addendum)
Postpartum Discharge Summary  Date of Service updated 09/01/21     Patient Name: Erica Williams DOB: 02/06/1996 MRN: [id number]  Date of admission: 08/28/2021 Delivery date:08/30/2021  Delivering provider: Renard Matter  Date of discharge: 09/01/2021  Admitting diagnosis: Supervision of other normal pregnancy, antepartum [Z34.80] Intrauterine pregnancy: [redacted]w[redacted]d    Secondary diagnosis:  Active Problems:   Elevated BP without diagnosis of hypertension   Supervision of low-risk pregnancy   GBS bacteriuria   Abnormal Pap smear of cervix   Supervision of other normal pregnancy, antepartum   Vaginal delivery  Additional problems: None    Discharge diagnosis: Term Pregnancy Delivered and PCourtenay                                             Post partum procedures: none Augmentation: Pitocin, Cytotec, and IP Foley Complications: [id number] Hospital course: Induction of Labor With Vaginal Delivery   25y.o. yo G2P1011 at 340w6das admitted to the hospital 08/28/2021 for induction of labor.  Indication for induction: PROM.  Patient had an uncomplicated labor course as follows: Membrane Rupture Time/Date: 8:10 PM ,08/28/2021   Delivery Method:Vaginal, Spontaneous  Episiotomy: None  Lacerations:  1st degree;Perineal  Details of delivery can be found in separate delivery note.  Patient had a routine postpartum course. Patient is discharged home 09/01/21.  Newborn Data: Birth date:08/30/2021  Birth time:9:50 PM  Gender:Female  Living status:Living  Apgars:9 ,9  Weight:3626 g   Magnesium Sulfate received: No BMZ received: No Rhophylac:N/A MMR:N/A T-DaP:Given prenatally Flu: N/A Transfusion:No  Physical exam  Vitals:   08/31/21 1000 08/31/21 1437 08/31/21 2331 09/01/21 0513  BP: 126/82 99/67 124/81 127/81  Pulse: 93 97 100 99  Resp: 18 19 18 18   Temp: 98.5 F (36.9 C) 99 F (37.2 C) 98.7 F (37.1 C) 98.4 F (36.9 C)  TempSrc: Oral Oral Oral   SpO2: 95% 95% 96% 99%   Weight:      Height:       General: alert, cooperative, and no distress Lochia: appropriate Uterine Fundus: firm Incision: N/A DVT Evaluation: No evidence of DVT seen on physical exam. Negative Homan's sign. No cords or calf tenderness. No significant calf/ankle edema. Labs: Lab Results  Component Value Date   WBC 15.4 (H) 08/31/2021   HGB 8.3 (L) 08/31/2021   HCT 24.3 (L) 08/31/2021   MCV 84.7 08/31/2021   PLT 191 08/31/2021   CMP Latest Ref Rng & Units 08/29/2021  Glucose 70 - 99 mg/dL 84  BUN 6 - 20 mg/dL 5(L)  Creatinine 0.44 - 1.00 mg/dL 0.55  Sodium 135 - 145 mmol/L 135  Potassium 3.5 - 5.1 mmol/L 3.7  Chloride 98 - 111 mmol/L 106  CO2 22 - 32 mmol/L 19(L)  Calcium 8.9 - 10.3 mg/dL 9.3  Total Protein 6.5 - 8.1 g/dL 6.9  Total Bilirubin 0.3 - 1.2 mg/dL 0.3  Alkaline Phos 38 - 126 U/L 111  AST 15 - 41 U/L 20  ALT 0 - 44 U/L 13   Edinburgh Score: Edinburgh Postnatal Depression Scale Screening Tool 08/31/2021  I have been able to laugh and see the funny side of things. 0  I have looked forward with enjoyment to things. 0  I have blamed myself unnecessarily when things went wrong. 0  I have been anxious or worried for no good reason. 0  I have felt scared or panicky for no good reason. 0  Things have been getting on top of me. 0  I have been so unhappy that I have had difficulty sleeping. 0  I have felt sad or miserable. 0  I have been so unhappy that I have been crying. 0  The thought of harming myself has occurred to me. 0  Edinburgh Postnatal Depression Scale Total 0     After visit meds:  Allergies as of 09/01/2021   No Known Allergies      Medication List     STOP taking these medications    aspirin 81 MG EC tablet       TAKE these medications    ferrous sulfate 325 (65 FE) MG tablet Take 1 tablet (325 mg total) by mouth every other day.   ibuprofen 600 MG tablet Commonly known as: ADVIL Take 1 tablet (600 mg total) by mouth every 6 (six)  hours as needed for moderate pain or cramping.   multivitamin-prenatal 27-0.8 MG Tabs tablet Take 1 tablet by mouth daily at 12 noon.         Discharge home in stable condition Infant Feeding: Breast Infant Disposition:home with mother Discharge instruction: per After Visit Summary and Postpartum booklet. Activity: Advance as tolerated. Pelvic rest for 6 weeks.  Diet: routine diet Future Appointments: Future Appointments  Date Time Provider [place]  09/07/2021 10:10 AM [facility] NURSE [facility]  10/04/2021 11:10 AM Roma Schanz, [name] [facility]   Follow up Visit:  Follow-up Information     [facility].   Specialty: Obstetrics and Gynecology Why: as scheduled Contact information: [address] [place] [phone]               Message sent to FT by Dr. Cy Blamer on 08/31/21  Please schedule this patient for a In person postpartum visit in 4 weeks with the following provider: MD. Additional Postpartum F/U:BP check 1 week  High risk pregnancy complicated by: HTN Delivery mode:  Vaginal, Spontaneous  Anticipated Birth Control:   OP IUD   09/01/2021 Roma Schanz, [name]

## 2021-08-31 NOTE — Progress Notes (Signed)
POSTPARTUM PROGRESS NOTE  Post Partum Day 2  Subjective:  Erica Williams is a 25 y.o. G2P1011 s/p SVD at [redacted]w[redacted]d.  No acute events overnight.  Pt denies problems with ambulating, voiding or po intake.  She denies nausea or vomiting.  Pain is well controlled.  She has had flatus. She has not had bowel movement.  Lochia Minimal.   Objective: Blood pressure 127/81, pulse 99, temperature 98.4 F (36.9 C), resp. rate 18, height 5\' 5"  (1.651 m), weight 112.5 kg, last menstrual period 12/01/2020, SpO2 99 %, unknown if currently breastfeeding.  Physical Exam:  General: alert, cooperative and no distress Chest: no respiratory distress Abdomen: soft, nontender,  Uterine Fundus: firm, appropriately tender DVT Evaluation: No calf swelling or tenderness Extremities: no edema Skin: warm, dry  Recent Labs    08/31/21 1029  HGB 8.3*  HCT 24.3*   Assessment/Plan: Erica Williams is a 25 y.o. G2P1011 s/p SVD at [redacted]w[redacted]d   PPD#2 - Doing well Contraception: IUD outpatient  Feeding: breast and bottle feeding  Circumcision: desires  Dispo: Plan for discharge  gHTN: Last BP 127/81. Asymptomatic. Consider Procardia 30 if readings show SBP > 140s or diastolic >90s    LOS: 3 days   [redacted]w[redacted]d, Medical Student 09/01/2021, 6:08 AM

## 2021-08-31 NOTE — Progress Notes (Addendum)
POSTPARTUM PROGRESS NOTE  Post Partum Day 1  Subjective:  Erica Williams is a 25 y.o. G2P1011 s/p SVD at [redacted]w[redacted]d.  No acute events overnight.  Pt denies problems with ambulating, voiding or po intake.  Pain is well controlled.  She has not had flatus. She has not had bowel movement.  Lochia scant.  Objective: Blood pressure 125/82, pulse 100, temperature 97.8 F (36.6 C), temperature source Oral, resp. rate 20, height 5\' 5"  (1.651 m), weight 112.5 kg, last menstrual period 12/01/2020, SpO2 97 %, unknown if currently breastfeeding.  Physical Exam:  General: alert, cooperative and no distress Chest: no respiratory distress Abdomen: soft, nontender  Uterine Fundus: firm, appropriately tender DVT Evaluation: No calf swelling or tenderness Extremities: no edema Skin: warm, dry  Recent Labs    08/29/21 0054  HGB 11.2*  HCT 33.6*   Assessment/Plan: Erica Williams is a 25 y.o. G2P1011 s/p SVD at [redacted]w[redacted]d   PPD#1 - Doing well    Contraception: IUD outpatient  Feeding: breast and bottle feeding   #gHTN Patient had some elevated BP (SBP 140s) while in labor. She did not need medication and her labs were normal. Her urine p:c was elevated but was done after rupture without a straight cath and therefore not valid.  - will continue to monitor - if has more readings with SBP > 140s or diastolic >90s will start Procardia 30 Circumcision: desires  Dispo: Pt desires to stay overnight    LOS: 2 days   [redacted]w[redacted]d, Medical Student 08/31/2021, 7:54 AM    GME ATTESTATION:  I saw and evaluated the patient. I made appropriate edits to patient's note and otherwise agree with the findings and the plan of care as documented in the student's note  09/02/2021, MD, MPH OB Fellow, Faculty Practice [facility], [facility] 08/31/2021 9:17 AM

## 2021-09-01 LAB — SURGICAL PATHOLOGY

## 2021-09-01 MED ORDER — IBUPROFEN 600 MG PO TABS: 600.0000 mg | ORAL_TABLET | Freq: Four times a day (QID) | ORAL | 0 refills | Status: DC | PRN

## 2021-09-01 NOTE — Discharge Instructions (Signed)
NO SEX UNTIL AFTER YOU GET YOUR BIRTH CONTROL  

## 2021-09-01 NOTE — Social Work (Signed)
CSW received consult for hx of marijuana use.  Referral was screened out due to the following: ~MOB had no documented substance use after initial prenatal visit/+UPT. ~MOB had no positive drug screens after initial prenatal visit/+UPT. ~Baby's UDS is negative.  Please consult CSW if current concerns arise or by MOB's request.  CSW will monitor CDS results and make report to Child Protective Services if warranted.    Siobahn Worsley, MSW, LCSW [facility]  Clinical Social Worker  [phone] 09/01/2021  8:48 AM  

## 2021-09-07 ENCOUNTER — Telehealth: Payer: Self-pay

## 2021-09-11 ENCOUNTER — Telehealth (HOSPITAL_COMMUNITY): Payer: Self-pay

## 2021-09-11 NOTE — Telephone Encounter (Signed)
No answer. Mailbox is full.   Marcelino Duster [facility] 09/11/2021,1218

## 2021-10-04 ENCOUNTER — Ambulatory Visit: Payer: Self-pay | Admitting: Women's Health

## 2021-10-18 ENCOUNTER — Ambulatory Visit: Payer: Self-pay | Admitting: Women's Health

## 2021-11-10 ENCOUNTER — Encounter: Payer: Self-pay | Admitting: Advanced Practice Midwife

## 2021-11-10 ENCOUNTER — Other Ambulatory Visit: Payer: Self-pay

## 2021-11-10 ENCOUNTER — Ambulatory Visit (INDEPENDENT_AMBULATORY_CARE_PROVIDER_SITE_OTHER): Payer: Medicaid Other | Admitting: Advanced Practice Midwife

## 2021-11-10 DIAGNOSIS — Z3202 Encounter for pregnancy test, result negative: Secondary | ICD-10-CM

## 2021-11-10 LAB — POCT URINE PREGNANCY: Preg Test, Ur: NEGATIVE

## 2021-11-10 NOTE — Progress Notes (Signed)
POSTPARTUM VISIT Patient name: Erica Williams MRN [id number]  Date of birth: December 15, 1995 Chief Complaint:   Contraception and Postpartum Care  History of Present Illness:   Erica Williams is a 25 y.o. G40P1011 female being seen today for a postpartum visit. She is 10 weeks postpartum following a spontaneous vaginal delivery at 38.6 gestational weeks. IOL: yes, for PROM. Anesthesia: epidural.  Laceration: R periurethral, L labial and 1st degree.  Complications: she had an isolated elevated BP while an inpatient . Inpatient contraception: no.   Pregnancy uncomplicated. Tobacco use: no. Substance use disorder: no. Last pap smear: March 2022 with ASCUS and +HRHPV, had colpo with ACW @ 12, needs repeat colpo 8-12wk PP. Next colpo due: in next month Patient's last menstrual period was 10/23/2021 (exact date).  Postpartum course has been uncomplicated. Bleeding none. Bowel function is normal. Bladder function is normal. Urinary incontinence? no, fecal incontinence? no Patient is sexually active. Last sexual activity:  on 10/30/21 . Desired contraception: IUD. Patient does not know about a pregnancy in the future.  Desired family size is unsure children.   Upstream - 11/10/21 1010       Pregnancy Intention Screening   Does the patient want to become pregnant in the next year? No    Does the patient's partner want to become pregnant in the next year? No    Would the patient like to discuss contraceptive options today? Yes      Contraception Wrap Up   Current Method Abstinence    End Method IUD or IUS    Contraception Counseling Provided Yes            The pregnancy intention screening data noted above was reviewed. Potential methods of contraception were discussed. The patient elected to proceed with IUD or IUS.  Edinburgh Postpartum Depression Screening: negative  Edinburgh Postnatal Depression Scale - 11/10/21 1008       Edinburgh Postnatal Depression Scale:  In the Past 7 Days   I  have been able to laugh and see the funny side of things. 0    I have looked forward with enjoyment to things. 0    I have blamed myself unnecessarily when things went wrong. 0    I have been anxious or worried for no good reason. 0    I have felt scared or panicky for no good reason. 0    Things have been getting on top of me. 0    I have been so unhappy that I have had difficulty sleeping. 0    I have felt sad or miserable. 0    I have been so unhappy that I have been crying. 0    The thought of harming myself has occurred to me. 0    Edinburgh Postnatal Depression Scale Total 0             GAD 7 : Generalized Anxiety Score 06/28/2021 02/24/2021  Nervous, Anxious, on Edge 0 0  Control/stop worrying 0 0  Worry too much - different things 0 0  Trouble relaxing 0 0  Restless 0 0  Easily annoyed or irritable 0 0  Afraid - awful might happen 0 0  Total GAD 7 Score 0 0     Baby's course has been uncomplicated. Baby is feeding by bottle. Infant has a pediatrician/family doctor? Yes.  Childcare strategy if returning to work/school: n/a-not working/going back to school.  Pt has material needs met for her and baby: Yes.   Review of Systems:  Pertinent items are noted in HPI Denies Abnormal vaginal discharge w/ itching/odor/irritation, headaches, visual changes, shortness of breath, chest pain, abdominal pain, severe nausea/vomiting, or problems with urination or bowel movements. Pertinent History Reviewed:  Reviewed past medical,surgical, obstetrical and family history.  Reviewed problem list, medications and allergies. OB History  Gravida Para Term Preterm AB Living  2 1 1   1 1   SAB IAB Ectopic Multiple Live Births    1   0 1    # Outcome Date GA Lbr Len/2nd Weight Sex Delivery Anes PTL Lv  2 Term 08/30/21 [id number]:06 / 01:15 7 lb 15.9 oz (3.626 kg) M Vag-Spont EPI, Local  LIV  1 IAB            Physical Assessment:   Vitals:   11/10/21 1010  BP: 123/87  Pulse: 85  Weight:  235 lb (106.6 kg)  Height: 5' 5"  (1.651 m)  Body mass index is 39.11 kg/m.       Physical Examination:   General appearance: alert, well appearing, and in no distress  Mental status: alert, oriented to person, place, and time  Skin: warm & dry   Cardiovascular: normal heart rate noted   Respiratory: normal respiratory effort, no distress   Breasts: deferred, no complaints   Abdomen: soft, non-tender   Pelvic: examination not indicated. Thin prep pap obtained: No  Rectal: not examined  Extremities: Edema: none         Results for orders placed or performed in visit on 11/10/21 (from the past 24 hour(s))  POCT urine pregnancy   Collection Time: 11/10/21 10:16 AM  Result Value Ref Range   Preg Test, Ur Negative Negative    Assessment & Plan:  1) Postpartum exam 2) 10 wks s/p spontaneous vaginal delivery 3) bottle feeding 4) Depression screening- negative 5) Contraception counseling: requesting IUD but had sex 11d ago; will have insurance thru work effective 12/12/21 so will schedule IUD in Jan and then 4wks after will get IUD f/u and colpo at the same visit  Essential components of care per ACOG recommendations:  1.  Mood and well being:  If positive depression screen, discussed and plan developed.  If using tobacco we discussed reduction/cessation and risk of relapse If current substance abuse, we discussed and referral to local resources was offered.   2. Infant care and feeding:  If breastfeeding, discussed returning to work, pumping, breastfeeding-associated pain, guidance regarding return to fertility while lactating if not using another method. If needed, patient was provided with a letter to be allowed to pump q 2-3hrs to support lactation in a private location with access to a refrigerator to store breastmilk.   Recommended that all caregivers be immunized for flu, pertussis and other preventable communicable diseases If pt does not have material needs met for her/baby,  referred to local resources for help obtaining these.  3. Sexuality, contraception and birth spacing Provided guidance regarding sexuality, management of dyspareunia, and resumption of intercourse Discussed avoiding interpregnancy interval <669ms and recommended birth spacing of 18 months  4. Sleep and fatigue Discussed coping options for fatigue and sleep disruption Encouraged family/partner/community support of 4 hrs of uninterrupted sleep to help with mood and fatigue  5. Physical recovery  If pt had a C/S, assessed incisional pain and providing guidance on normal vs prolonged recovery If pt had a laceration, perineal healing and pain reviewed.  If urinary or fecal incontinence, discussed management and referred to PT or uro/gyn if indicated  Patient  is safe to resume physical activity. Discussed attainment of healthy weight.  6.  Chronic disease management Discussed pregnancy complications if any, and their implications for future childbearing and long-term maternal health. Review recommendations for prevention of recurrent pregnancy complications, such as 17 hydroxyprogesterone caproate to reduce risk for recurrent PTB not applicable, or aspirin to reduce risk of preeclampsia yes. Pt had GDM: no. If yes, 2hr GTT scheduled: not applicable. Reviewed medications and non-pregnant dosing including consideration of whether pt is breastfeeding using a reliable resource such as LactMed: not applicable Referred for f/u w/ PCP or subspecialist providers as indicated: not applicable  7. Health maintenance Mammogram at 26yo or earlier if indicated Pap smears as indicated  Meds: No orders of the defined types were placed in this encounter.   Follow-up: Return for IUD insertion in Jan, then 4wks later IUD check & colpo.   Orders Placed This Encounter  Procedures   POCT urine pregnancy    Erica [facility] 11/10/2021 10:35 AM

## 2021-12-14 ENCOUNTER — Encounter: Payer: Self-pay | Admitting: Women's Health

## 2021-12-14 ENCOUNTER — Other Ambulatory Visit: Payer: Self-pay

## 2021-12-14 ENCOUNTER — Ambulatory Visit (INDEPENDENT_AMBULATORY_CARE_PROVIDER_SITE_OTHER): Payer: No Typology Code available for payment source | Admitting: Women's Health

## 2021-12-14 VITALS — BP 140/89 | HR 95 | Ht 65.0 in | Wt 239.0 lb

## 2021-12-14 DIAGNOSIS — Z3201 Encounter for pregnancy test, result positive: Secondary | ICD-10-CM

## 2021-12-14 DIAGNOSIS — Z30430 Encounter for insertion of intrauterine contraceptive device: Secondary | ICD-10-CM

## 2021-12-14 LAB — POCT URINE PREGNANCY: Preg Test, Ur: POSITIVE — AB

## 2021-12-14 MED ORDER — LEVONORGESTREL 20.1 MCG/DAY IU IUD: 1.0000 | INTRAUTERINE_SYSTEM | Freq: Once | INTRAUTERINE | Status: DC

## 2021-12-14 NOTE — Patient Instructions (Signed)
Lexany, thank you for choosing our office today! We appreciate the opportunity to meet your healthcare needs. You may receive a short survey by mail, e-mail, or through MyChart. If you are happy with your care we would appreciate if you could take just a few minutes to complete the survey questions. We read all of your comments and take your feedback very seriously. Thank you again for choosing our office.  [facility]  [facility] [place] ([address] , [place] [address]) Entrance C, located off of [address] Free 24/7 valet parking   Nausea & Vomiting Have saltine crackers or pretzels by your bed and eat a few bites before you raise your head out of bed in the morning Eat small frequent meals throughout the day instead of large meals Drink plenty of fluids throughout the day to stay hydrated, just don't drink a lot of fluids with your meals.  This can make your stomach fill up faster making you feel sick Do not brush your teeth right after you eat Products with real ginger are good for nausea, like ginger ale and ginger hard candy Make sure it says made with real ginger! Sucking on sour candy like lemon heads is also good for nausea If your prenatal vitamins make you nauseated, take them at night so you will sleep through the nausea Sea Bands If you feel like you need medicine for the nausea & vomiting please let us know If you are unable to keep any fluids or food down please let us know   Constipation Drink plenty of fluid, preferably water, throughout the day Eat foods high in fiber such as fruits, vegetables, and grains Exercise, such as walking, is a good way to keep your bowels regular Drink warm fluids, especially warm prune juice, or decaf coffee Eat a 1/2 cup of real oatmeal (not instant), 1/2 cup applesauce, and 1/2-1 cup warm prune juice every day If needed, you may take Colace (docusate sodium) stool softener  once or twice a day to help keep the stool soft.  If you still are having problems with constipation, you may take Miralax once daily as needed to help keep your bowels regular.   Home Blood Pressure Monitoring for Patients   Your provider has recommended that you check your blood pressure (BP) at least once a week at home. If you do not have a blood pressure cuff at home, one will be provided for you. Contact your provider if you have not received your monitor within 1 week.   Helpful Tips for Accurate Home Blood Pressure Checks  Don't smoke, exercise, or drink caffeine 30 minutes before checking your BP Use the restroom before checking your BP (a full bladder can raise your pressure) Relax in a comfortable upright chair Feet on the ground Left arm resting comfortably on a flat surface at the level of your heart Legs uncrossed Back supported Sit quietly and don't talk Place the cuff on your bare arm Adjust snuggly, so that only two fingertips can fit between your skin and the top of the cuff Check 2 readings separated by at least one minute Keep a log of your BP readings For a visual, please reference this diagram: http://ccnc.care/bpdiagram  Provider Name: [facility]     Phone: [phone]  Zone 1: ALL CLEAR  Continue to monitor your symptoms:  BP reading is less than 140 (top number) or less than 90 (bottom   number)  No right upper stomach pain No headaches or seeing spots No feeling nauseated or throwing up No swelling in face and hands  Zone 2: CAUTION Call your doctor's office for any of the following:  BP reading is greater than 140 (top number) or greater than 90 (bottom number)  Stomach pain under your ribs in the middle or right side Headaches or seeing spots Feeling nauseated or throwing up Swelling in face and hands  Zone 3: EMERGENCY  Seek immediate medical care if you have any of the following:  BP reading is greater than160 (top number) or greater than  110 (bottom number) Severe headaches not improving with Tylenol Serious difficulty catching your breath Any worsening symptoms from Zone 2    First Trimester of Pregnancy The first trimester of pregnancy is from week 1 until the end of week 12 (months 1 through 3). A week after a sperm fertilizes an egg, the egg will implant on the wall of the uterus. This embryo will begin to develop into a baby. Genes from you and your partner are forming the baby. The female genes determine whether the baby is a boy or a girl. At 6-8 weeks, the eyes and face are formed, and the heartbeat can be seen on ultrasound. At the end of 12 weeks, all the baby's organs are formed.  Now that you are pregnant, you will want to do everything you can to have a healthy baby. Two of the most important things are to get good prenatal care and to follow your health care provider's instructions. Prenatal care is all the medical care you receive before the baby's birth. This care will help prevent, find, and treat any problems during the pregnancy and childbirth. BODY CHANGES Your body goes through many changes during pregnancy. The changes vary from woman to woman.  You may gain or lose a couple of pounds at first. You may feel sick to your stomach (nauseous) and throw up (vomit). If the vomiting is uncontrollable, call your health care provider. You may tire easily. You may develop headaches that can be relieved by medicines approved by your health care provider. You may urinate more often. Painful urination may mean you have a bladder infection. You may develop heartburn as a result of your pregnancy. You may develop constipation because certain hormones are causing the muscles that push waste through your intestines to slow down. You may develop hemorrhoids or swollen, bulging veins (varicose veins). Your breasts may begin to grow larger and become tender. Your nipples may stick out more, and the tissue that surrounds them  (areola) may become darker. Your gums may bleed and may be sensitive to brushing and flossing. Dark spots or blotches (chloasma, mask of pregnancy) may develop on your face. This will likely fade after the baby is born. Your menstrual periods will stop. You may have a loss of appetite. You may develop cravings for certain kinds of food. You may have changes in your emotions from day to day, such as being excited to be pregnant or being concerned that something may go wrong with the pregnancy and baby. You may have more vivid and strange dreams. You may have changes in your hair. These can include thickening of your hair, rapid growth, and changes in texture. Some women also have hair loss during or after pregnancy, or hair that feels dry or thin. Your hair will most likely return to normal after your baby is born. WHAT TO EXPECT AT YOUR PRENATAL   VISITS During a routine prenatal visit: You will be weighed to make sure you and the baby are growing normally. Your blood pressure will be taken. Your abdomen will be measured to track your baby's growth. The fetal heartbeat will be listened to starting around week 10 or 12 of your pregnancy. Test results from any previous visits will be discussed. Your health care provider may ask you: How you are feeling. If you are feeling the baby move. If you have had any abnormal symptoms, such as leaking fluid, bleeding, severe headaches, or abdominal cramping. If you have any questions. Other tests that may be performed during your first trimester include: Blood tests to find your blood type and to check for the presence of any previous infections. They will also be used to check for low iron levels (anemia) and Rh antibodies. Later in the pregnancy, blood tests for diabetes will be done along with other tests if problems develop. Urine tests to check for infections, diabetes, or protein in the urine. An ultrasound to confirm the proper growth and development  of the baby. An amniocentesis to check for possible genetic problems. Fetal screens for spina bifida and Down syndrome. You may need other tests to make sure you and the baby are doing well. HOME CARE INSTRUCTIONS  Medicines Follow your health care provider's instructions regarding medicine use. Specific medicines may be either safe or unsafe to take during pregnancy. Take your prenatal vitamins as directed. If you develop constipation, try taking a stool softener if your health care provider approves. Diet Eat regular, well-balanced meals. Choose a variety of foods, such as meat or vegetable-based protein, fish, milk and low-fat dairy products, vegetables, fruits, and whole grain breads and cereals. Your health care provider will help you determine the amount of weight gain that is right for you. Avoid raw meat and uncooked cheese. These carry germs that can cause birth defects in the baby. Eating four or five small meals rather than three large meals a day may help relieve nausea and vomiting. If you start to feel nauseous, eating a few soda crackers can be helpful. Drinking liquids between meals instead of during meals also seems to help nausea and vomiting. If you develop constipation, eat more high-fiber foods, such as fresh vegetables or fruit and whole grains. Drink enough fluids to keep your urine clear or pale yellow. Activity and Exercise Exercise only as directed by your health care provider. Exercising will help you: Control your weight. Stay in shape. Be prepared for labor and delivery. Experiencing pain or cramping in the lower abdomen or low back is a good sign that you should stop exercising. Check with your health care provider before continuing normal exercises. Try to avoid standing for long periods of time. Move your legs often if you must stand in one place for a long time. Avoid heavy lifting. Wear low-heeled shoes, and practice good posture. You may continue to have sex  unless your health care provider directs you otherwise. Relief of Pain or Discomfort Wear a good support bra for breast tenderness.   Take warm sitz baths to soothe any pain or discomfort caused by hemorrhoids. Use hemorrhoid cream if your health care provider approves.   Rest with your legs elevated if you have leg cramps or low back pain. If you develop varicose veins in your legs, wear support hose. Elevate your feet for 15 minutes, 3-4 times a day. Limit salt in your diet. Prenatal Care Schedule your prenatal visits by the   twelfth week of pregnancy. They are usually scheduled monthly at first, then more often in the last 2 months before delivery. Write down your questions. Take them to your prenatal visits. Keep all your prenatal visits as directed by your health care provider. Safety Wear your seat belt at all times when driving. Make a list of emergency phone numbers, including numbers for family, friends, the hospital, and police and fire departments. General Tips Ask your health care provider for a referral to a local prenatal education class. Begin classes no later than at the beginning of month 6 of your pregnancy. Ask for help if you have counseling or nutritional needs during pregnancy. Your health care provider can offer advice or refer you to specialists for help with various needs. Do not use hot tubs, steam rooms, or saunas. Do not douche or use tampons or scented sanitary pads. Do not cross your legs for long periods of time. Avoid cat litter boxes and soil used by cats. These carry germs that can cause birth defects in the baby and possibly loss of the fetus by miscarriage or stillbirth. Avoid all smoking, herbs, alcohol, and medicines not prescribed by your health care provider. Chemicals in these affect the formation and growth of the baby. Schedule a dentist appointment. At home, brush your teeth with a soft toothbrush and be gentle when you floss. SEEK MEDICAL CARE IF:   You have dizziness. You have mild pelvic cramps, pelvic pressure, or nagging pain in the abdominal area. You have persistent nausea, vomiting, or diarrhea. You have a bad smelling vaginal discharge. You have pain with urination. You notice increased swelling in your face, hands, legs, or ankles. SEEK IMMEDIATE MEDICAL CARE IF:  You have a fever. You are leaking fluid from your vagina. You have spotting or bleeding from your vagina. You have severe abdominal cramping or pain. You have rapid weight gain or loss. You vomit blood or material that looks like coffee grounds. You are exposed to German measles and have never had them. You are exposed to fifth disease or chickenpox. You develop a severe headache. You have shortness of breath. You have any kind of trauma, such as from a fall or a car accident. Document Released: 11/22/2001 Document Revised: 04/14/2014 Document Reviewed: 10/08/2013 Exit[employer] Patient Information 2015 [employer]. This information is not intended to replace advice given to you by your health care provider. Make sure you discuss any questions you have with your health care provider.  

## 2021-12-14 NOTE — Progress Notes (Signed)
° °  GYN VISIT Patient name: Erica Williams MRN [id number]  Date of birth: [birth year] Chief Complaint:   Contraception  History of Present Illness:   Erica Williams is a 26 y.o. G66P1011 African-American female being seen today for IUD insertion, however she has a +UPT. Normal LMP 12/15, no sex since, [redacted]w[redacted]d by that LMP.   Kind of had a feeling she may be pregnant, couple of days she just didn't feel good. No n/v. Not taking pnv.  Patient's last menstrual period was 11/25/2021 (approximate). The current method of family planning is none.  Last pap 02/24/21. Results were: ASCUS w/ HRHPV positive: other (not 16, 18/45)  Depression screen [facility] 2/9 06/28/2021 02/24/2021  Decreased Interest 0 0  Down, Depressed, Hopeless 0 0  PHQ - 2 Score 0 0  Altered sleeping 0 0  Tired, decreased energy 0 0  Change in appetite 0 0  Feeling bad or failure about yourself  0 0  Trouble concentrating 0 0  Moving slowly or fidgety/restless 0 0  Suicidal thoughts 0 0  PHQ-9 Score 0 0     GAD 7 : Generalized Anxiety Score 06/28/2021 02/24/2021  Nervous, Anxious, on Edge 0 0  Control/stop worrying 0 0  Worry too much - different things 0 0  Trouble relaxing 0 0  Restless 0 0  Easily annoyed or irritable 0 0  Afraid - awful might happen 0 0  Total GAD 7 Score 0 0     Review of Systems:   Pertinent items are noted in HPI Denies fever/chills, dizziness, headaches, visual disturbances, fatigue, shortness of breath, chest pain, abdominal pain, vomiting, abnormal vaginal discharge/itching/odor/irritation, problems with periods, bowel movements, urination, or intercourse unless otherwise stated above.  Pertinent History Reviewed:  Reviewed past medical,surgical, social, obstetrical and family history.  Reviewed problem list, medications and allergies. Physical Assessment:   Vitals:   12/14/21 0915  BP: 140/89  Pulse: 95  Weight: 239 lb (108.4 kg)  Height: 5\' 5"  (1.651 m)  Body mass index is 39.77 kg/m.        Physical Examination:   General appearance: alert, well appearing, and in no distress  Mental status: alert, oriented to person, place, and time  Skin: warm & dry   Cardiovascular: normal heart rate noted  Respiratory: normal respiratory effort, no distress  Abdomen: soft, non-tender   Pelvic: examination not indicated  Extremities: no edema   Chaperone: N/A    Results for orders placed or performed in visit on 12/14/21 (from the past 24 hour(s))  POCT urine pregnancy   Collection Time: 12/14/21  9:27 AM  Result Value Ref Range   Preg Test, Ur Positive (A) Negative    Assessment & Plan:  1) [redacted]w[redacted]d by LMP> will check HCG too, then schedule dating u/s. Start pnv. No n/v right now  Meds:  Meds ordered this encounter  Medications   DISCONTD: levonorgestrel (LILETTA) 20.1 MCG/DAY IUD 1 each    Orders Placed This Encounter  Procedures   Beta hCG quant (ref lab)   POCT urine pregnancy    Return for will call pt w/ results.  [redacted]w[redacted]d [name], [facility] 12/14/2021 9:47 AM

## 2021-12-15 LAB — BETA HCG QUANT (REF LAB): hCG Quant: 379 m[IU]/mL

## 2022-01-12 ENCOUNTER — Encounter: Payer: Medicaid Other | Admitting: Advanced Practice Midwife

## 2022-01-12 ENCOUNTER — Encounter: Payer: Medicaid Other | Admitting: Women's Health

## 2022-02-04 ENCOUNTER — Other Ambulatory Visit: Payer: Self-pay | Admitting: Obstetrics & Gynecology

## 2022-02-04 DIAGNOSIS — O3680X0 Pregnancy with inconclusive fetal viability, not applicable or unspecified: Secondary | ICD-10-CM

## 2022-02-07 ENCOUNTER — Other Ambulatory Visit: Payer: Medicaid Other

## 2022-02-09 ENCOUNTER — Ambulatory Visit (INDEPENDENT_AMBULATORY_CARE_PROVIDER_SITE_OTHER): Payer: No Typology Code available for payment source | Admitting: Women's Health

## 2022-02-09 ENCOUNTER — Encounter: Payer: Self-pay | Admitting: Women's Health

## 2022-02-09 ENCOUNTER — Other Ambulatory Visit: Payer: Self-pay

## 2022-02-09 VITALS — BP 130/93 | HR 97 | Ht 65.0 in | Wt 238.0 lb

## 2022-02-09 DIAGNOSIS — Z3201 Encounter for pregnancy test, result positive: Secondary | ICD-10-CM

## 2022-02-09 DIAGNOSIS — O039 Complete or unspecified spontaneous abortion without complication: Secondary | ICD-10-CM | POA: Diagnosis not present

## 2022-02-09 DIAGNOSIS — Z113 Encounter for screening for infections with a predominantly sexual mode of transmission: Secondary | ICD-10-CM | POA: Diagnosis not present

## 2022-02-09 LAB — POCT URINE PREGNANCY: Preg Test, Ur: POSITIVE — AB

## 2022-02-09 NOTE — Progress Notes (Signed)
? ?GYN VISIT ?Patient name: Erica Williams MRN [id number]  Date of birth: June 17, 1996 ?Chief Complaint:   ?Follow-up (On miscarriage from 2/5; bleeding has stopped but still cramping some) ? ?History of Present Illness:   ?Erica Williams is a 26 y.o. [phone] African-American female being seen today for f/u miscarriage. I saw her 12/14/21 and had +PT, HCG was 379. She went to [facility] on 2/4 when she woke up and bed was full of blood, they told her she was having a miscarriage. She continued to bleed until 2/25. Had a neg HPT this Monday. Still having some cramping.  ?No records available from [facility] in Care Everywhere ?No LMP recorded. ?The current method of family planning is none.  ?Last pap 02/24/21. Results were: ASCUS w/ HRHPV positive: other (not 16, 18/45) ? ?Depression screen [facility] 2/9 06/28/2021 02/24/2021  ?Decreased Interest 0 0  ?Down, Depressed, Hopeless 0 0  ?PHQ - 2 Score 0 0  ?Altered sleeping 0 0  ?Tired, decreased energy 0 0  ?Change in appetite 0 0  ?Feeling bad or failure about yourself  0 0  ?Trouble concentrating 0 0  ?Moving slowly or fidgety/restless 0 0  ?Suicidal thoughts 0 0  ?PHQ-9 Score 0 0  ? ?  ?GAD 7 : Generalized Anxiety Score 06/28/2021 02/24/2021  ?Nervous, Anxious, on Edge 0 0  ?Control/stop worrying 0 0  ?Worry too much - different things 0 0  ?Trouble relaxing 0 0  ?Restless 0 0  ?Easily annoyed or irritable 0 0  ?Afraid - awful might happen 0 0  ?Total GAD 7 Score 0 0  ? ? ? ?Review of Systems:   ?Pertinent items are noted in HPI ?Denies fever/chills, dizziness, headaches, visual disturbances, fatigue, shortness of breath, chest pain, abdominal pain, vomiting, abnormal vaginal discharge/itching/odor/irritation, problems with periods, bowel movements, urination, or intercourse unless otherwise stated above.  ?Pertinent History Reviewed:  ?Reviewed past medical,surgical, social, obstetrical and family history.  ?Reviewed problem list, medications and allergies. ?Physical Assessment:  ? ?Vitals:   ? 02/09/22 1030  ?BP: (!) 130/93  ?Pulse: 97  ?Weight: 238 lb (108 kg)  ?Height: 5\' 5"  (1.651 m)  ?Body mass index is 39.61 kg/m?. ? ?     Physical Examination:  ? General appearance: alert, well appearing, and in no distress ? Mental status: alert, oriented to person, place, and time ? Skin: warm & dry  ? Cardiovascular: normal heart rate noted ? Respiratory: normal respiratory effort, no distress ? Abdomen: soft, non-tender  ? Pelvic: examination not indicated ? Extremities: no edema  ? ?Informal TA u/s: uterus appears empty, hard to get a good pic ? ?Chaperone: N/A   ? ?Results for orders placed or performed in visit on 02/09/22 (from the past 24 hour(s))  ?POCT urine pregnancy  ? Collection Time: 02/09/22 10:35 AM  ?Result Value Ref Range  ? Preg Test, Ur Positive (A) Negative  ?  ?Assessment & Plan:  ?1) Miscarriage> will check HCG today, as long as declining will bring her back in for IUD as she does not want another pregnancy right now. Abstinence until then ? ?2) +CT in Sept> poc today ? ?3) H/O abnormal pap> will repeat at next appt ? ?Meds: No orders of the defined types were placed in this encounter. ? ? ?Orders Placed This Encounter  ?Procedures  ? GC/Chlamydia Probe Amp  ? Beta hCG quant (ref lab)  ? POCT urine pregnancy  ? ? ?Return for will call. ? ?Roma Schanz [name], [name] ?  02/09/2022 ?11:05 AM  ?

## 2022-02-10 LAB — BETA HCG QUANT (REF LAB): hCG Quant: 17 m[IU]/mL

## 2022-02-11 ENCOUNTER — Encounter: Payer: Self-pay | Admitting: Women's Health

## 2022-02-11 DIAGNOSIS — A749 Chlamydial infection, unspecified: Secondary | ICD-10-CM | POA: Insufficient documentation

## 2022-02-11 LAB — GC/CHLAMYDIA PROBE AMP
Chlamydia trachomatis, NAA: NEGATIVE
Neisseria Gonorrhoeae by PCR: NEGATIVE

## 2022-02-14 ENCOUNTER — Other Ambulatory Visit: Payer: Self-pay

## 2022-02-14 ENCOUNTER — Encounter: Payer: Self-pay | Admitting: Women's Health

## 2022-02-14 ENCOUNTER — Ambulatory Visit (INDEPENDENT_AMBULATORY_CARE_PROVIDER_SITE_OTHER): Payer: No Typology Code available for payment source | Admitting: Women's Health

## 2022-02-14 VITALS — BP 134/92 | HR 96 | Wt 240.0 lb

## 2022-02-14 DIAGNOSIS — Z30430 Encounter for insertion of intrauterine contraceptive device: Secondary | ICD-10-CM | POA: Diagnosis not present

## 2022-02-14 MED ORDER — LEVONORGESTREL 20.1 MCG/DAY IU IUD
1.0000 | INTRAUTERINE_SYSTEM | Freq: Once | INTRAUTERINE | Status: AC
  Administered 2022-02-14: 1 via INTRAUTERINE

## 2022-02-14 NOTE — Progress Notes (Signed)
? ?  IUD INSERTION ?Patient name: Lorraine Cimmino MRN [id number]  Date of birth: 1996-09-02 ?Subjective Findings:   ?Bernestine Holsapple is a 26 y.o. G23P1021 African American female being seen today for insertion of a Liletta IUD. Recent SAB. HCG 379 (12/14/21), down to 17 (02/09/22). Has not had sex since miscarriage.  ?No LMP recorded (lmp unknown). ?Last sexual intercourse was prior to her recent miscarriage ?Last pap3/16/22. Results were: ASCUS w/ HRHPV positive: other (not 16, 18/45) ? ?The risks and benefits of the method and placement have been thouroughly reviewed with the patient and all questions were answered.  Specifically the patient is aware of failure rate of 12/998, expulsion of the IUD and of possible perforation.  The patient is aware of irregular bleeding due to the method and understands the incidence of irregular bleeding diminishes with time.  Signed copy of informed consent in chart.  ? ?Depression screen [facility] 2/9 06/28/2021 02/24/2021  ?Decreased Interest 0 0  ?Down, Depressed, Hopeless 0 0  ?PHQ - 2 Score 0 0  ?Altered sleeping 0 0  ?Tired, decreased energy 0 0  ?Change in appetite 0 0  ?Feeling bad or failure about yourself  0 0  ?Trouble concentrating 0 0  ?Moving slowly or fidgety/restless 0 0  ?Suicidal thoughts 0 0  ?PHQ-9 Score 0 0  ? ?  ?GAD 7 : Generalized Anxiety Score 06/28/2021 02/24/2021  ?Nervous, Anxious, on Edge 0 0  ?Control/stop worrying 0 0  ?Worry too much - different things 0 0  ?Trouble relaxing 0 0  ?Restless 0 0  ?Easily annoyed or irritable 0 0  ?Afraid - awful might happen 0 0  ?Total GAD 7 Score 0 0  ? ? ? ?Pertinent History Reviewed:   ?Reviewed past medical,surgical, social, obstetrical and family history.  ?Reviewed problem list, medications and allergies. ?Objective Findings & Procedure:  ? ?Vitals:  ? 02/14/22 1451  ?BP: (!) 134/92  ?Pulse: 96  ?Weight: 240 lb (108.9 kg)  ?Body mass index is 39.94 kg/m?. ? ?No results found for this or any previous visit (from the past 24  hour(s)).  ? ?Time out was performed. ? ?A graves speculum was placed in the vagina.  The cervix was visualized, prepped using Betadine, and grasped with a single tooth tenaculum. The uterus was found to be neutral and it sounded to 8 cm.  ?Liletta  IUD placed per manufacturer's recommendations. The strings were trimmed to approximately 3 cm. The patient tolerated the procedure well.  ? ?Informal transvaginal sonogram was performed and the proper placement of the IUD was verified. ? ?Chaperone:  Berneta Sages, CMA    ?Assessment & Plan:   ?1) Liletta IUD insertion ?The patient was given post procedure instructions, including signs and symptoms of infection and to check for the strings after each menses or each month, and refraining from intercourse or anything in the vagina for 3 days. ?She was given a care card with date IUD placed, and date IUD to be removed. ?She is scheduled for a f/u appointment in 4 weeks. ? ?2) H/o abnormal pap> repeat at IUD f/u in 4wks ? ?No orders of the defined types were placed in this encounter. ? ?F/U 4wks for pap & IUD check up ? ?Cheral Marker [name], [name] ?02/14/2022 ?3:20 PM  ?

## 2022-02-14 NOTE — Progress Notes (Signed)
Patient here for intrauterine device insertion. When asked about patient last period she answered " I don't know because I just had a miscarriage."  ? ?UPT was done and resulted "Positive"  ? ?Patient informed me hat she has not had sex since the miscarriage ? ?Romonda Parker, CMA  ?

## 2022-02-14 NOTE — Addendum Note (Signed)
Addended by: Guy Begin on: 02/14/2022 03:51 PM ? ? Modules accepted: Orders ? ?

## 2022-02-14 NOTE — Patient Instructions (Signed)
Nothing in vagina for 3 days (no sex, douching, tampons, etc...) Check your strings once a month to make sure you can feel them, if you are not able to please let us know If you develop a fever of 100.4 or more in the next few weeks, or if you develop severe abdominal pain, please let us know Use a backup method of birth control, such as condoms, for 2 weeks  Intrauterine Device Insertion, Care After This sheet gives you information about how to care for yourself after your procedure. Your health care provider may also give you more specific instructions. If you have problems or questions, contact your health care provider. What can I expect after the procedure? After the procedure, it is common to have: Cramps and pain in the abdomen. Bleeding. It may be light or heavy. This may last for a few days. Lower back pain. Dizziness. Headaches. Nausea. Follow these instructions at home:  Before resuming sexual activity, check to make sure that you can feel the IUD string or strings. You should be able to feel the end of the string below the opening of your cervix. If your IUD string is in place, you may resume sexual activity. If you had a hormonal IUD inserted more than 7 days after your most recent period started, you will need to use a backup method of birth control for 7 days after IUD insertion. Ask your health care provider whether this applies to you. Continue to check that the IUD is still in place by feeling for the strings after every menstrual period, or once a month. An IUD will not protect you from sexually transmitted infections (STIs). Use methods to prevent the exchange of body fluids between partners (barrier protection) every time you have sex. Barrier protection can be used during oral, vaginal, or anal sex. Commonly used barrier methods include: Female condom. Female condom. Dental dam. Take over-the-counter and prescription medicines only as told by your health care  provider. Keep all follow-up visits as told by your health care provider. This is important. Contact a health care provider if: You feel light-headed or weak. You have any of the following problems with your IUD string or strings: The string bothers or hurts you or your sexual partner. You cannot feel the string. The string has gotten longer. You can feel the IUD in your vagina. You think you may be pregnant, or you miss your menstrual period. You think you may have a sexually transmitted infection (STI). Get help right away if: You have flu-like symptoms, such as tiredness (fatigue) and muscle aches. You have a fever and chills. You have bleeding that is heavier or lasts longer than a normal menstrual cycle. You have abnormal or bad-smelling discharge from your vagina. You develop abdominal pain that is new, is getting worse, or is not in the same area of earlier cramping and pain. You have pain during sexual activity. Summary After the procedure, it is common to have cramps and pain in the abdomen. It is also common to have light bleeding or heavier bleeding that is like your menstrual period. Continue to check that the IUD is still in place by feeling for the strings after every menstrual period, or once a month. Keep all follow-up visits as told by your health care provider. This is important. Contact your health care provider if you have problems with your IUD strings, such as the string getting longer or bothering you or your sexual partner. This information is not intended   to replace advice given to you by your health care provider. Make sure you discuss any questions you have with your health care provider. Document Revised: 11/19/2019 Document Reviewed: 11/19/2019 Elsevier Patient Education  2022 [employer] Inc[employer]  

## 2022-02-26 IMAGING — CT CT RENAL STONE PROTOCOL
2 of 4 series · 16 of 46 positions shown, 18 images · non-contrast
Comparison: None.

CLINICAL DATA: Right-sided flank pain.

EXAM:
CT ABDOMEN AND PELVIS WITHOUT CONTRAST
TECHNIQUE: Multidetector CT imaging of the abdomen and pelvis was performed
following the standard protocol without IV contrast.

[Series 2: axial st · axial · 0.86mm/px · z∈[+512,+942]mm · 13 of 98 slices shown, 15 images]
[im 6/98  soft-tissue]
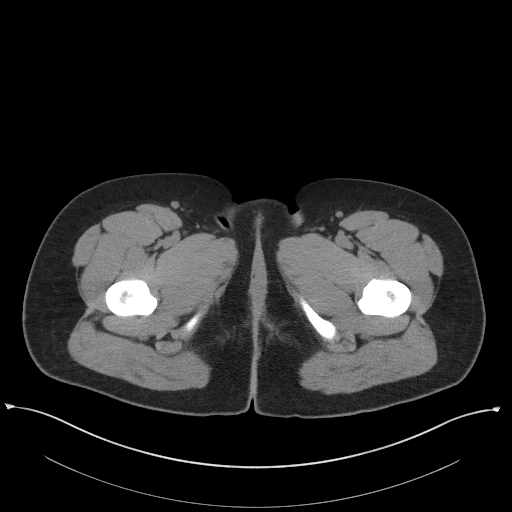
[im 6/98  bone]
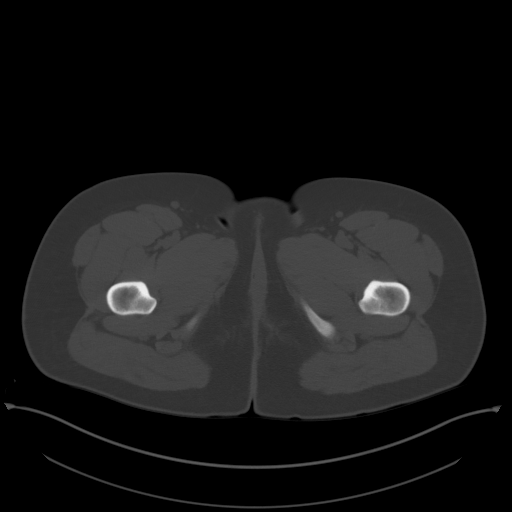
[im 12/98  soft-tissue]
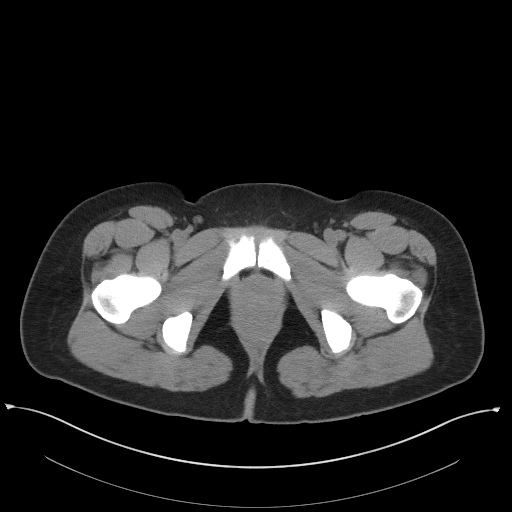
[im 23/98  soft-tissue]
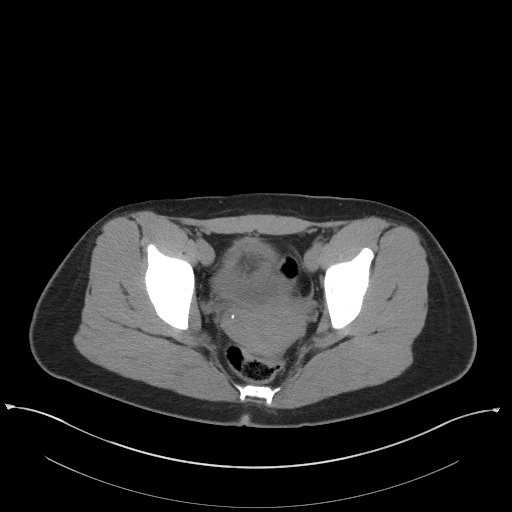
[im 29/98  soft-tissue]
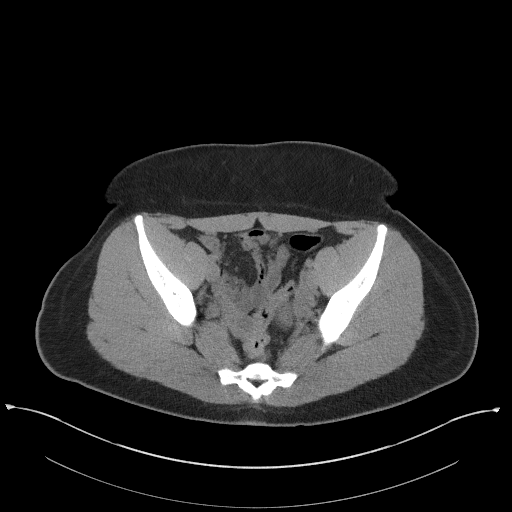
[im 35/98  soft-tissue]
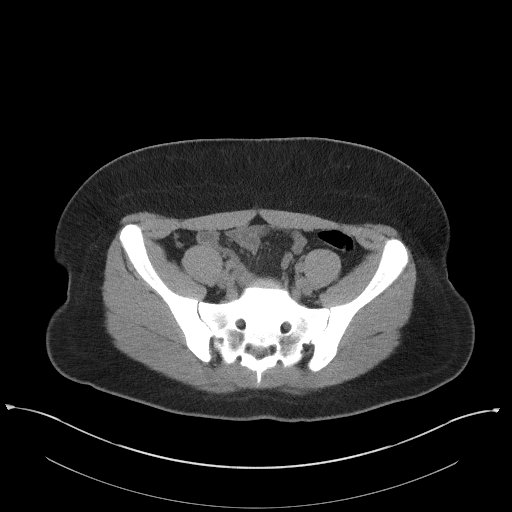
[im 40/98  soft-tissue]
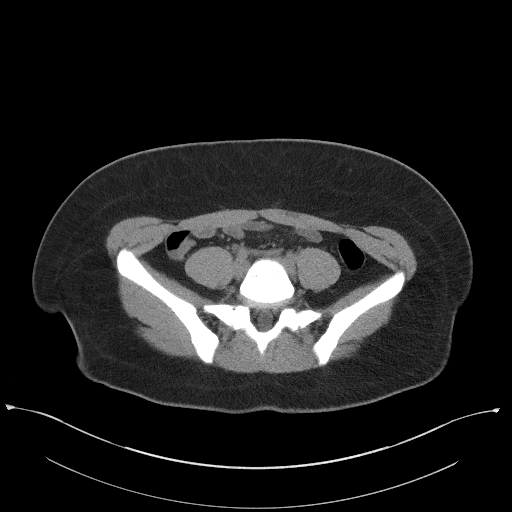
[im 52/98  soft-tissue]
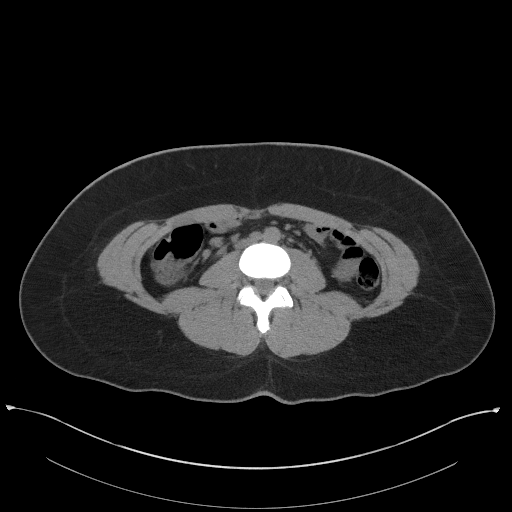
[im 58/98  soft-tissue]
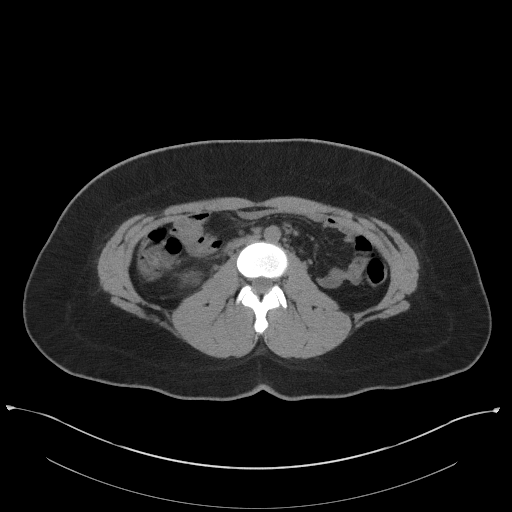
[im 63/98  soft-tissue]
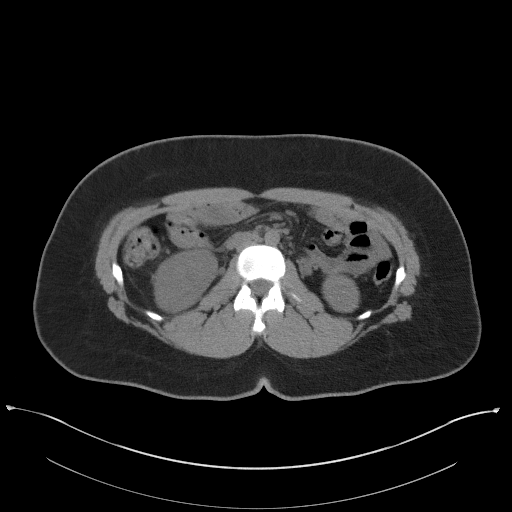
[im 63/98  bone]
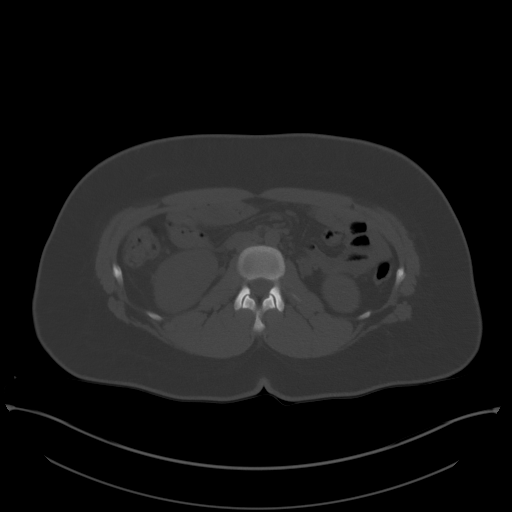
[im 69/98  soft-tissue]
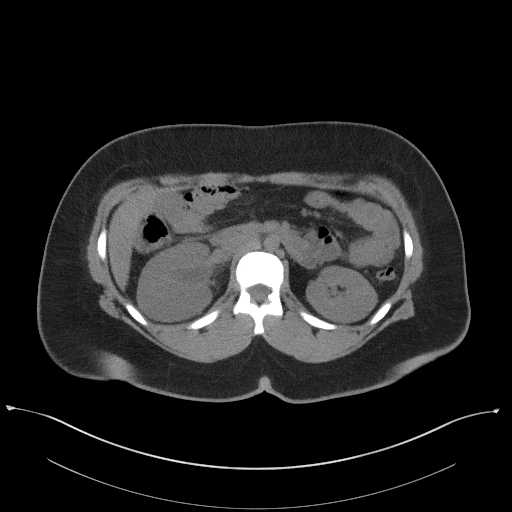
[im 75/98  soft-tissue]
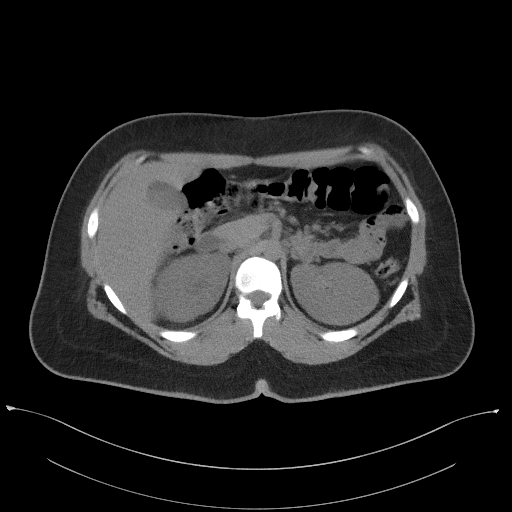
[im 86/98  soft-tissue]
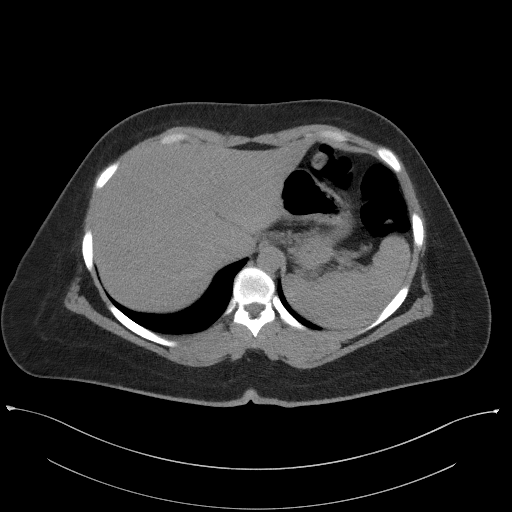
[im 92/98  soft-tissue]
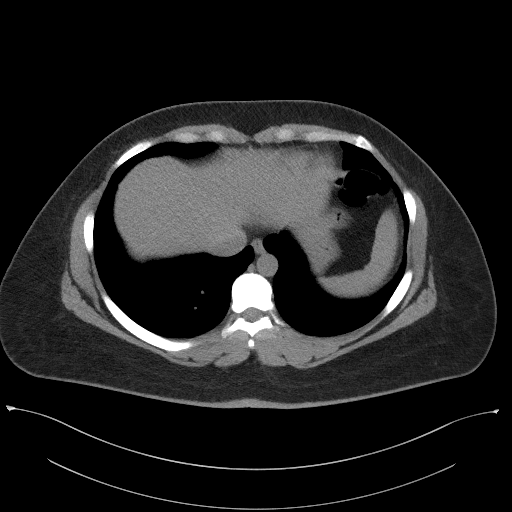

[Series 5: coronal st · coronal · 0.86mm/px · 3 of 100 slices shown]
[im 34/100  soft-tissue]
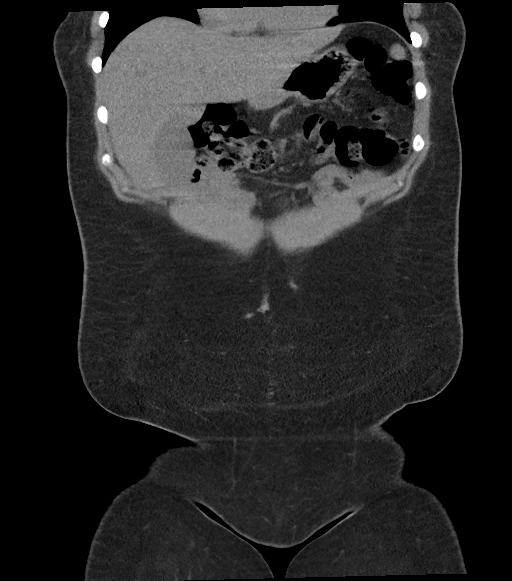
[im 45/100  soft-tissue]
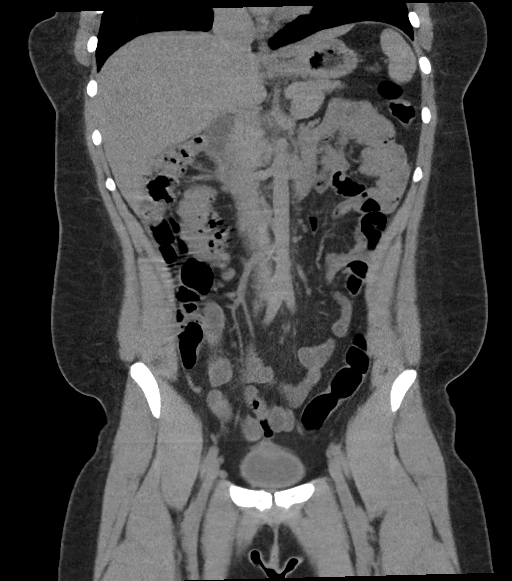
[im 56/100  soft-tissue]
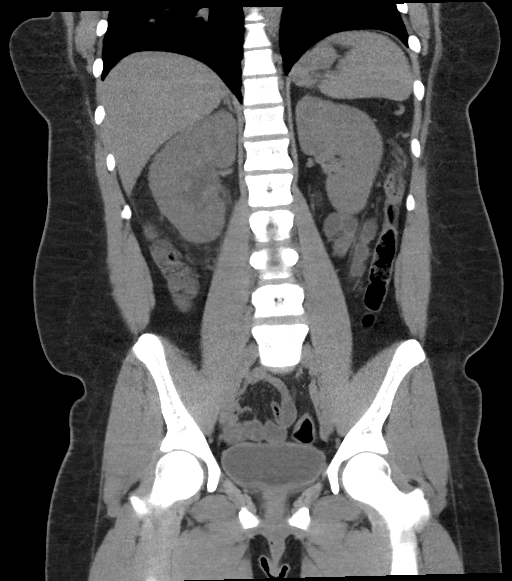

[16 of 46 positions shown; findings below may reference images not displayed]

FINDINGS: Lower chest: The lung bases are clear. The heart size is normal.

Hepatobiliary: The liver is normal. Normal gallbladder.There is no
biliary ductal dilation.

Pancreas: Normal contours without ductal dilatation. No
peripancreatic fluid collection.

Spleen: Unremarkable.

Adrenals/Urinary Tract:

--Adrenal glands: Unremarkable.

--Right kidney/ureter: There is mild right-sided
hydroureteronephrosis secondary to an obstructing 1 mm stone in the
distal right ureter nearly at the right UVJ (axial series 2, image
78).

--Left kidney/ureter: No hydronephrosis or radiopaque kidney stones.

--Urinary bladder: Unremarkable.

Stomach/Bowel:

--Stomach/Duodenum: No hiatal hernia or other gastric abnormality.
Normal duodenal course and caliber.

--Small bowel: Unremarkable.

--Colon: Unremarkable.

--Appendix: Normal.

Vascular/Lymphatic: Normal course and caliber of the major abdominal
vessels.

--No retroperitoneal lymphadenopathy.

--No mesenteric lymphadenopathy.

--No pelvic or inguinal lymphadenopathy.

Reproductive: Unremarkable

Other: No ascites or free air. The abdominal wall is normal.

Musculoskeletal. No acute displaced fractures.
IMPRESSION: Mild right-sided hydroureteronephrosis secondary to an obstructing 1
mm stone in the distal right ureter nearly at the right UVJ.

## 2022-03-14 ENCOUNTER — Other Ambulatory Visit: Payer: No Typology Code available for payment source | Admitting: Women's Health
